# Patient Record
Sex: Male | Born: 1980 | Race: White | Hispanic: No | Smoking: Current every day smoker
Health system: Southern US, Community
[De-identification: ages and names within clinical notes are randomized; demographics above are authoritative.]

## PROBLEM LIST (undated history)

## (undated) DIAGNOSIS — R569 Unspecified convulsions: Secondary | ICD-10-CM

---

## 2005-09-04 ENCOUNTER — Emergency Department: Payer: Self-pay | Admitting: Emergency Medicine

## 2005-09-16 ENCOUNTER — Other Ambulatory Visit: Payer: Self-pay

## 2005-09-16 ENCOUNTER — Emergency Department: Payer: Self-pay | Admitting: Emergency Medicine

## 2005-09-17 ENCOUNTER — Emergency Department: Payer: Self-pay | Admitting: Emergency Medicine

## 2007-02-22 ENCOUNTER — Emergency Department: Payer: Self-pay | Admitting: Emergency Medicine

## 2008-01-08 ENCOUNTER — Emergency Department: Payer: Self-pay | Admitting: Emergency Medicine

## 2008-01-19 ENCOUNTER — Emergency Department: Payer: Self-pay | Admitting: Emergency Medicine

## 2009-01-24 ENCOUNTER — Emergency Department: Payer: Self-pay | Admitting: Emergency Medicine

## 2009-03-18 ENCOUNTER — Emergency Department: Payer: Self-pay | Admitting: Unknown Physician Specialty

## 2010-01-29 ENCOUNTER — Emergency Department: Payer: Self-pay | Admitting: Unknown Physician Specialty

## 2014-04-22 ENCOUNTER — Emergency Department: Payer: Self-pay | Admitting: Emergency Medicine

## 2016-12-10 ENCOUNTER — Emergency Department: Payer: Self-pay

## 2016-12-10 ENCOUNTER — Emergency Department
Admission: EM | Admit: 2016-12-10 | Discharge: 2016-12-10 | Disposition: A | Discharge status: Home / Self Care | Payer: Self-pay | Attending: Emergency Medicine | Admitting: Emergency Medicine

## 2016-12-10 DIAGNOSIS — B9789 Other viral agents as the cause of diseases classified elsewhere: Secondary | ICD-10-CM

## 2016-12-10 DIAGNOSIS — F17200 Nicotine dependence, unspecified, uncomplicated: Secondary | ICD-10-CM | POA: Insufficient documentation

## 2016-12-10 DIAGNOSIS — J069 Acute upper respiratory infection, unspecified: Secondary | ICD-10-CM | POA: Insufficient documentation

## 2016-12-10 MED ORDER — PSEUDOEPH-BROMPHEN-DM 30-2-10 MG/5ML PO SYRP: 10.0000 mL | ORAL_SOLUTION | Freq: Four times a day (QID) | ORAL | 0 refills | Status: DC | PRN

## 2016-12-10 MED ORDER — FLUTICASONE PROPIONATE 50 MCG/ACT NA SUSP: 1.0000 | Freq: Two times a day (BID) | NASAL | 0 refills | Status: DC

## 2016-12-10 MED ORDER — CETIRIZINE HCL 10 MG PO TABS: 10.0000 mg | ORAL_TABLET | Freq: Every day | ORAL | 0 refills | Status: AC

## 2016-12-10 NOTE — ED Triage Notes (Signed)
Pt reports fatigue and cough that began last night. Pt also reporting sore throat. Pt alert and oriented X4, active, cooperative, pt in NAD. RR even and unlabored, color WNL.

## 2016-12-10 NOTE — ED Provider Notes (Signed)
[facility] Emergency Department Provider Note  ____________________________________________  Time seen: Approximately 8:02 PM  I have reviewed the triage vital signs and the nursing notes.   HISTORY  Chief Complaint Cough    HPI William PumaJimmy D Sinor Jr. is a 36 y.o. male who presents emergency Department with 2 day history of his congestion, sore throat, coughing. Patient states that symptoms began insidiously. He has had a subjective fever along with these complaints. No headache, visual changes, neck pain, chest pain, difficulty breathing, abdominal pain, nausea or vomiting. No medications prior to arrival.   History reviewed. No pertinent past medical history.  There are no active problems to display for this patient.   History reviewed. No pertinent surgical history.  Prior to Admission medications   Medication Sig Start Date End Date Taking? Authorizing Provider  brompheniramine-pseudoephedrine-DM 30-2-10 MG/5ML syrup Take 10 mLs by mouth 4 (four) times daily as needed. 12/10/16   Delorise RoyalsJonathan D Jamylah Marinaccio, PA-C  cetirizine (ZYRTEC) 10 MG tablet Take 1 tablet (10 mg total) by mouth daily. 12/10/16   Delorise RoyalsJonathan D Bently Wyss, PA-C  fluticasone (FLONASE) 50 MCG/ACT nasal spray Place 1 spray into both nostrils 2 (two) times daily. 12/10/16   Delorise RoyalsJonathan D Daquavion Catala, PA-C    Allergies Amoxicillin; Aspirin; and Penicillins  No family history on file.  Social History Social History  Substance Use Topics  . Smoking status: Current Every Day Smoker  . Smokeless tobacco: Never Used  . Alcohol use No     Review of Systems  Constitutional: Subjective fever/chills Eyes: No visual changes. No discharge ENT: Positive for nasal congestion and sore throat Cardiovascular: no chest pain. Respiratory: Positive cough. No SOB. Gastrointestinal: No abdominal pain.  No nausea, no vomiting.  No diarrhea.  No constipation. Musculoskeletal: Negative for musculoskeletal pain. Skin:  Negative for rash, abrasions, lacerations, ecchymosis. Neurological: Negative for headaches, focal weakness or numbness. 10-point ROS otherwise negative.  ____________________________________________   PHYSICAL EXAM:  VITAL SIGNS: ED Triage Vitals  Enc Vitals Group     BP 12/10/16 1818 (!) 147/81     Pulse Rate 12/10/16 1818 91     Resp 12/10/16 1818 18     Temp 12/10/16 1818 98 F (36.7 C)     Temp Source 12/10/16 1818 Oral     SpO2 12/10/16 1818 98 %     Weight 12/10/16 1819 192 lb (87.1 kg)     Height 12/10/16 1819 5\' 7"  (1.702 m)     Head Circumference --      Peak Flow --      Pain Score --      Pain Loc --      Pain Edu? --      Excl. in GC? --      Constitutional: Alert and oriented. Well appearing and in no acute distress. Eyes: Conjunctivae are normal. PERRL. EOMI. Head: Atraumatic. ENT:      Ears: He sees and TMs are unremarkable bilaterally      Nose: Moderate clear congestion/rhinnorhea.      Mouth/Throat: Mucous membranes are moist. Her pharynx is mildly erythematous but not edematous. Uvula is midline. Tonsils are unremarkable. Neck: No stridor. Neck is supple with full range of motion Hematological/Lymphatic/Immunilogical: No cervical lymphadenopathy. Cardiovascular: Normal rate, regular rhythm. Normal S1 and S2.  Good peripheral circulation. Respiratory: Normal respiratory effort without tachypnea or retractions. Lungs CTAB. Good air entry to the bases with no decreased or absent breath sounds. Musculoskeletal: Full range of motion to all extremities. No gross  deformities appreciated. Neurologic:  Normal speech and language. No gross focal neurologic deficits are appreciated.  Skin:  Skin is warm, dry and intact. No rash noted. Psychiatric: Mood and affect are normal. Speech and behavior are normal. Patient exhibits appropriate insight and judgement.   ____________________________________________   LABS (all labs ordered are listed, but only abnormal  results are displayed)  Labs Reviewed - No data to display ____________________________________________  EKG   ____________________________________________  RADIOLOGY Festus Barren Emylee Decelle, personally viewed and evaluated these images (plain radiographs) as part of my medical decision making, as well as reviewing the written report by the radiologist.  Dg Chest 2 View  Result Date: 12/10/2016 CLINICAL DATA:  Fatigue and cough. EXAM: CHEST  2 VIEW COMPARISON:  None. FINDINGS: Low lung volumes exaggerate the heart size. The lungs are clear. There is no edema or effusion. The visualized soft tissues and bony thorax are unremarkable. IMPRESSION: Negative two view chest. Electronically Signed   By: Marin Roberts M.D.   On: 12/10/2016 19:27    ____________________________________________    PROCEDURES  Procedure(s) performed:    Procedures    Medications - No data to display   ____________________________________________   INITIAL IMPRESSION / ASSESSMENT AND PLAN / ED COURSE  Pertinent labs & imaging results that were available during my care of the patient were reviewed by me and considered in my medical decision making (see chart for details).  Review of the [place] CSRS was performed in accordance of the [facility] prior to dispensing any controlled drugs.  Clinical Course     Patient's diagnosis is consistent with Probably upper respiratory infection with cough. X-ray reveals no acute cardiopulmonary abnormality. Exam is reassuring with no acute findings.. Patient will be discharged home with prescriptions for symptom control medications to take in addition to Tylenol and Motrin at home. Patient is to follow up with primary care as needed or otherwise directed. Patient is given ED precautions to return to the ED for any worsening or new symptoms.     ____________________________________________  FINAL CLINICAL IMPRESSION(S) / ED DIAGNOSES  Final diagnoses:  Viral  URI with cough      NEW MEDICATIONS STARTED DURING THIS VISIT:  New Prescriptions   BROMPHENIRAMINE-PSEUDOEPHEDRINE-DM 30-2-10 MG/5ML SYRUP    Take 10 mLs by mouth 4 (four) times daily as needed.   CETIRIZINE (ZYRTEC) 10 MG TABLET    Take 1 tablet (10 mg total) by mouth daily.   FLUTICASONE (FLONASE) 50 MCG/ACT NASAL SPRAY    Place 1 spray into both nostrils 2 (two) times daily.        This chart was dictated using voice recognition software/Dragon. Despite best efforts to proofread, errors can occur which can change the meaning. Any change was purely unintentional.    Racheal Patches, PA-C 12/10/16 2011    Minna Antis, MD 12/10/16 [phone]

## 2016-12-10 NOTE — ED Notes (Signed)
See triage note. States he developed cough and body aches yesterday  Woke up this am feeling tired and has a scratchy throat and afebrile on arrival

## 2016-12-20 ENCOUNTER — Encounter: Payer: Self-pay | Admitting: Emergency Medicine

## 2016-12-20 ENCOUNTER — Emergency Department
Admission: EM | Admit: 2016-12-20 | Discharge: 2016-12-20 | Disposition: A | Discharge status: Home / Self Care | Payer: Self-pay | Attending: Emergency Medicine | Admitting: Emergency Medicine

## 2016-12-20 ENCOUNTER — Emergency Department: Payer: Self-pay

## 2016-12-20 DIAGNOSIS — F17210 Nicotine dependence, cigarettes, uncomplicated: Secondary | ICD-10-CM | POA: Insufficient documentation

## 2016-12-20 DIAGNOSIS — Y9389 Activity, other specified: Secondary | ICD-10-CM | POA: Insufficient documentation

## 2016-12-20 DIAGNOSIS — M7042 Prepatellar bursitis, left knee: Secondary | ICD-10-CM | POA: Insufficient documentation

## 2016-12-20 DIAGNOSIS — M7052 Other bursitis of knee, left knee: Secondary | ICD-10-CM

## 2016-12-20 MED ORDER — PREDNISONE 20 MG PO TABS: ORAL_TABLET | ORAL | 0 refills | Status: AC

## 2016-12-20 NOTE — ED Notes (Signed)
See triage note   States he is having increased with some swelling to left knee over the past 2 weeks  Denies any specific injury

## 2016-12-20 NOTE — ED Provider Notes (Signed)
[facility] Emergency Department Provider Note  ____________________________________________  Time seen: Approximately 7:04 PM  I have reviewed the triage vital signs and the nursing notes.   HISTORY  Chief Complaint Knee Pain    HPI William Hardin. is a 36 y.o. male , NAD, presents to the emergency department with 3 week history of left knee pain and swelling. Patient denies any specific injury, trauma or fall to incite the pain. States the pain is worse when he squats down and attempts to stand back up. 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 Denies any numbness, weakness or tingling. Has not noted any redness or abnormal warmth. Denies fevers or chills. Has been taking Tylenol without any alleviation of pain.   History reviewed. No pertinent past medical history.  There are no active problems to display for this patient.   History reviewed. No pertinent surgical history.  Prior to Admission medications   Medication Sig Start Date End Date Taking? Authorizing Provider  cetirizine (ZYRTEC) 10 MG tablet Take 1 tablet (10 mg total) by mouth daily. 12/10/16   Delorise RoyalsJonathan D Cuthriell, PA-C  predniSONE (DELTASONE) 20 MG tablet Take 2 tablets by mouth, once daily, for 5 days 12/20/16   Ceniya Fowers L Saydee Zolman, PA-C    Allergies Amoxicillin; Aspirin; Codeine; and Penicillins  History reviewed. No pertinent family history.  Social History Social History  Substance Use Topics  . Smoking status: Current Every Day Smoker    Packs/day: 0.50    Types: Cigarettes  . Smokeless tobacco: Never Used  . Alcohol use No     Review of Systems  Constitutional: No fever/chills Musculoskeletal: Positive left knee pain.  Skin: Positive for left knee swelling. Negative for rash, Redness, abnormal warmth, skin sores, open wounds. Neurological: Negative for Numbness, weakness,  tingling  ____________________________________________   PHYSICAL EXAM:  VITAL SIGNS: ED Triage Vitals  Enc Vitals Group     BP 12/20/16 1739 (!) 110/93     Pulse Rate 12/20/16 1739 90     Resp 12/20/16 1739 18     Temp 12/20/16 1739 98.4 F (36.9 C)     Temp Source 12/20/16 1739 Oral     SpO2 12/20/16 1739 97 %     Weight 12/20/16 1740 195 lb (88.5 kg)     Height 12/20/16 1740 5\' 7"  (1.702 m)     Head Circumference --      Peak Flow --      Pain Score 12/20/16 1740 6     Pain Loc --      Pain Edu? --      Excl. in GC? --      Constitutional: Alert and oriented. Well appearing and in no acute distress. Eyes: Conjunctivae are normal.  Head: Atraumatic. Cardiovascular: Good peripheral circulation with 2+ pulses noted in the left lower extremity. Respiratory: Normal respiratory effort without tachypnea or retractions.  Musculoskeletal: Tenderness and mild swelling noted about the suprapatellar region. No prepatellar erythema or warmth. No tenderness about the patella or with patellofemoral ground. Patient has full range of motion of the left knee but pain with full flexion and extension at the suprapatellar region. No laxity with anterior or posterior drawer. No laxity with varus and valgus stress. No locking of the knee with range of motion. No lower extremity tenderness nor edema.  No joint effusions. Neurologic:  Normal speech and language. No gross focal neurologic deficits are  appreciated.  Skin:  Skin is warm, dry and intact. No rash, redness, abnormal warmth noted. Psychiatric: Mood and affect are normal. Speech and behavior are normal. Patient exhibits appropriate insight and judgement.   ____________________________________________   LABS  None ____________________________________________  EKG  None ____________________________________________  RADIOLOGY I, Hope Pigeon, personally viewed and evaluated these images (plain radiographs) as part of my medical  decision making, as well as reviewing the written report by the radiologist.  Dg Knee Complete 4 Views Left  Result Date: 12/20/2016 CLINICAL DATA:  Pain and swelling of left knee x 2-3 weeks, progressively getting worse. Much worse today. No known injury, no prior surgeries. EXAM: LEFT KNEE - COMPLETE 4+ VIEW COMPARISON:  03/18/2009 FINDINGS: No fracture.  No bone lesion. Knee joint is normally spaced and aligned.  No arthropathic change. No joint effusion. Soft tissues are unremarkable. IMPRESSION: Negative. Electronically Signed   By: Amie Portland M.D.   On: 12/20/2016 18:09    ____________________________________________    PROCEDURES  Procedure(s) performed: None   Procedures   Medications - No data to display   ____________________________________________   INITIAL IMPRESSION / ASSESSMENT AND PLAN / ED COURSE  Pertinent labs & imaging results that were available during my care of the patient were reviewed by me and considered in my medical decision making (see chart for details).  Clinical Course     Patient's diagnosis is consistent with Suprapatellar bursitis of left knee. Patient will be discharged home with prescriptions for prednisone to take as directed. Patient should purchase a neoprene knee sleeve over-the-counter to wear for support. Patient is to follow up with Dr. Martha Clan in orthopedics if symptoms persist past this treatment course. Patient is given ED precautions to return to the ED for any worsening or new symptoms.    ____________________________________________  FINAL CLINICAL IMPRESSION(S) / ED DIAGNOSES  Final diagnoses:  Suprapatellar bursitis of left knee      NEW MEDICATIONS STARTED DURING THIS VISIT:  New Prescriptions   PREDNISONE (DELTASONE) 20 MG TABLET    Take 2 tablets by mouth, once daily, for 5 days         Hope Pigeon, PA-C 12/20/16 1939    Myrna Blazer, MD 12/20/16 [phone]

## 2016-12-20 NOTE — ED Triage Notes (Signed)
Pt reports chronic left knee pain that has worsened over the past 2 weeks with swelling for the past 3 days. 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 Pt walks with a slight limp.  Pt has not taken any medications for pain or swelling.

## 2017-05-27 ENCOUNTER — Emergency Department: Payer: Commercial Managed Care - PPO

## 2017-05-27 ENCOUNTER — Encounter: Payer: Self-pay | Admitting: *Deleted

## 2017-05-27 ENCOUNTER — Emergency Department
Admission: EM | Admit: 2017-05-27 | Discharge: 2017-05-27 | Disposition: A | Discharge status: Home / Self Care | Payer: Commercial Managed Care - PPO | Attending: Student in an Organized Health Care Education/Training Program | Admitting: Student in an Organized Health Care Education/Training Program

## 2017-05-27 DIAGNOSIS — F17210 Nicotine dependence, cigarettes, uncomplicated: Secondary | ICD-10-CM | POA: Insufficient documentation

## 2017-05-27 DIAGNOSIS — R079 Chest pain, unspecified: Secondary | ICD-10-CM | POA: Insufficient documentation

## 2017-05-27 DIAGNOSIS — Z79899 Other long term (current) drug therapy: Secondary | ICD-10-CM | POA: Diagnosis not present

## 2017-05-27 DIAGNOSIS — E860 Dehydration: Secondary | ICD-10-CM | POA: Insufficient documentation

## 2017-05-27 LAB — BASIC METABOLIC PANEL
ANION GAP: 6 (ref 5–15)
BUN: 15 mg/dL (ref 6–20)
CALCIUM: 9.2 mg/dL (ref 8.9–10.3)
CO2: 30 mmol/L (ref 22–32)
Chloride: 104 mmol/L (ref 101–111)
Creatinine, Ser: 0.95 mg/dL (ref 0.61–1.24)
GFR calc Af Amer: >60 mL/min (ref >60)
GFR calc non Af Amer: >60 mL/min (ref >60)
GLUCOSE: 117 mg/dL — AB (ref 65–99)
Potassium: 4.5 mmol/L (ref 3.5–5.1)
Sodium: 140 mmol/L (ref 135–145)

## 2017-05-27 LAB — CBC
HCT: 40.8 % (ref 40.0–52.0)
Hemoglobin: 14.4 g/dL (ref 13.0–18.0)
MCH: 32 pg (ref 26.0–34.0)
MCHC: 35.1 g/dL (ref 32.0–36.0)
MCV: 91.1 fL (ref 80.0–100.0)
Platelets: 268 10*3/uL (ref 150–440)
RBC: 4.48 MIL/uL (ref 4.40–5.90)
RDW: 13.3 % (ref 11.5–14.5)
WBC: 11.5 10*3/uL — ABNORMAL HIGH (ref 3.8–10.6)

## 2017-05-27 LAB — GLUCOSE, CAPILLARY: Glucose-Capillary: 100 mg/dL — ABNORMAL HIGH (ref 65–99)

## 2017-05-27 LAB — TROPONIN I: Troponin I: <0.03 ng/mL (ref <0.03)

## 2017-05-27 MED ORDER — KETOROLAC TROMETHAMINE 30 MG/ML IJ SOLN
15.0000 mg | Freq: Once | INTRAMUSCULAR | Status: AC
  Administered 2017-05-27: 15 mg via INTRAVENOUS
  Filled 2017-05-27: qty 1

## 2017-05-27 MED ORDER — SODIUM CHLORIDE 0.9 % IV BOLUS (SEPSIS)
1000.0000 mL | Freq: Once | INTRAVENOUS | Status: AC
  Administered 2017-05-27: 1000 mL via INTRAVENOUS

## 2017-05-27 MED ORDER — ALBUTEROL SULFATE HFA 108 (90 BASE) MCG/ACT IN AERS: 2.0000 | INHALATION_SPRAY | Freq: Four times a day (QID) | RESPIRATORY_TRACT | 2 refills | Status: AC | PRN

## 2017-05-27 MED ORDER — IPRATROPIUM-ALBUTEROL 0.5-2.5 (3) MG/3ML IN SOLN
3.0000 mL | Freq: Once | RESPIRATORY_TRACT | Status: AC
  Administered 2017-05-27: 3 mL via RESPIRATORY_TRACT
  Filled 2017-05-27: qty 3

## 2017-05-27 NOTE — ED Provider Notes (Signed)
[facility] Emergency Department Provider Note    First MD Initiated Contact with Patient 05/27/17 1935     (approximate)  I have reviewed the triage vital signs and the nursing notes.   HISTORY  Chief Complaint Chest Pain    HPI Merrilyn PumaJimmy D Moscoso Jr. is a 36 y.o. male presents with left-sided chest discomfort associated shortness of breath that occurred while at work earlier today. Patient states he's also been feeling weak and lightheaded. 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 States he's had had decreased oral intake and is not staying hydrated. Denies any cough. No fevers. No exertional chest pain. Has had symptoms consistent with this in the past associated with dehydration.   PMH: previously healthy FMH: DM PSH: no recent surgeries There are no active problems to display for this patient.     Prior to Admission medications   Medication Sig Start Date End Date Taking? Authorizing Provider  cetirizine (ZYRTEC) 10 MG tablet Take 1 tablet (10 mg total) by mouth daily. 12/10/16   Cuthriell, Delorise RoyalsJonathan D, PA-C  predniSONE (DELTASONE) 20 MG tablet Take 2 tablets by mouth, once daily, for 5 days 12/20/16   Hagler, Jami L, PA-C    Allergies Amoxicillin; Aspirin; Codeine; and Penicillins    Social History Social History  Substance Use Topics  . Smoking status: Current Every Day Smoker    Packs/day: 0.50    Types: Cigarettes  . Smokeless tobacco: Never Used  . Alcohol use No    Review of Systems Patient denies headaches, rhinorrhea, blurry vision, numbness, shortness of breath, chest pain, edema, cough, abdominal pain, nausea, vomiting, diarrhea, dysuria, fevers, rashes or hallucinations unless otherwise stated above in HPI. ____________________________________________   PHYSICAL EXAM:  VITAL SIGNS: Vitals:   05/27/17 1804  BP: 131/78  Pulse: 72  Resp: 20  Temp: 98.4 F (36.9 C)      Constitutional: Alert and oriented. Well appearing and in no acute distress. Eyes: Conjunctivae are normal.  Head: Atraumatic. Nose: No congestion/rhinnorhea. Mouth/Throat: Mucous membranes are moist.   Neck: No stridor. Painless ROM.  Cardiovascular: Normal rate, regular rhythm. Grossly normal heart sounds.  Good peripheral circulation. Respiratory: Normal respiratory effort.  No retractions. Lungs CTAB. Gastrointestinal: Soft and nontender. No distention. No abdominal bruits. No CVA tenderness. Genitourinary:  Musculoskeletal: No lower extremity tenderness nor edema.  No joint effusions. Neurologic:  Normal speech and language. No gross focal neurologic deficits are appreciated. No facial droop Skin:  Skin is warm, dry and intact. No rash noted. Psychiatric: Mood and affect are normal. Speech and behavior are normal.  ____________________________________________   LABS (all labs ordered are listed, but only abnormal results are displayed)  Results for orders placed or performed during the hospital encounter of 05/27/17 (from the past 24 hour(s))  Basic metabolic panel     Status: Abnormal   Collection Time: 05/27/17  6:00 PM  Result Value Ref Range   Sodium 140 135 - 145 mmol/L   Potassium 4.5 3.5 - 5.1 mmol/L   Chloride 104 101 - 111 mmol/L   CO2 30 22 - 32 mmol/L   Glucose, Bld 117 (H) 65 - 99 mg/dL   BUN 15 6 - 20 mg/dL   Creatinine, Ser 1.610.95 0.61 - 1.24 mg/dL   Calcium 9.2 8.9 - 09.610.3 mg/dL   GFR calc non Af Amer >60 >60 mL/min   GFR calc Af Amer >60 >60 mL/min  Anion gap 6 5 - 15  CBC     Status: Abnormal   Collection Time: 05/27/17  6:00 PM  Result Value Ref Range   WBC 11.5 (H) 3.8 - 10.6 K/uL   RBC 4.48 4.40 - 5.90 MIL/uL   Hemoglobin 14.4 13.0 - 18.0 g/dL   HCT 16.1 09.6 - 04.5 %   MCV 91.1 80.0 - 100.0 fL   MCH 32.0 26.0 - 34.0 pg   MCHC 35.1 32.0 - 36.0 g/dL   RDW 40.9 81.1 - 91.4 %   Platelets 268 150 - 440 K/uL  Troponin I     Status: None    Collection Time: 05/27/17  6:00 PM  Result Value Ref Range   Troponin I <0.03 <0.03 ng/mL   ____________________________________________  EKG My review and personal interpretation at Time: 17:57   Indication: chest pain  Rate: 75  Rhythm: sinus Axis: normal Other: J point elevation in V2 and V 3, no reciprocal changes, normal intervals ____________________________________________  RADIOLOGY  I personally reviewed all radiographic images ordered to evaluate for the above acute complaints and reviewed radiology reports and findings.  These findings were personally discussed with the patient.  Please see medical record for radiology report.  ____________________________________________   PROCEDURES  Procedure(s) performed:  Procedures    Critical Care performed: no ____________________________________________   INITIAL IMPRESSION / ASSESSMENT AND PLAN / ED COURSE  Pertinent labs & imaging results that were available during my care of the patient were reviewed by me and considered in my medical decision making (see chart for details).  DDX: ACS, pericarditis, esophagitis, boerhaaves, pe, dissection, pna, bronchitis, costochondritis   Maykel D Lorie Melichar. is a 36 y.o. who presents to the ED with chest pain as described above.  Patient is AFVSS in ED. Exam as above. Given current presentation have considered the above differential. Phyllis clinically consistent with ACS. The patient has a heart score of essentially 0 and has no evidence of acute ischemic changes on EKG and his troponin is negative. He is low risk by well's criteria and is PERC negative. His abdominal exam is soft and benign. Is not consistent with dissection. No evidence of pneumonia. Possible component of bronchitis. Denies any majority of the symptoms related to heat illness and dehydration. Will provide IV fluids.    Clinical Course as of May 28 2335  Wed May 27, 2017  2226 Repeat troponin negative. Patient had  acute distress. Patient stable for follow-up with PCP.  Have discussed with the patient and available family all diagnostics and treatments performed thus far and all questions were answered to the best of my ability. The patient demonstrates understanding and agreement with plan.   [PR]    Clinical Course User Index [PR] Willy Eddy, MD     ____________________________________________   FINAL CLINICAL IMPRESSION(S) / ED DIAGNOSES  Final diagnoses:  Chest pain, unspecified type  Dehydration      NEW MEDICATIONS STARTED DURING THIS VISIT:  New Prescriptions   No medications on file     Note:  This document was prepared using Dragon voice recognition software and may include unintentional dictation errors.    Willy Eddy, MD 05/27/17 [phone]

## 2017-05-27 NOTE — ED Triage Notes (Signed)
Pt reports chest pain, sob for 1 day.  Pt in left chest.  cig smoker.  No cough   Pt alert.

## 2017-05-27 NOTE — ED Notes (Signed)

## 2017-12-10 ENCOUNTER — Emergency Department
Admission: EM | Admit: 2017-12-10 | Discharge: 2017-12-10 | Disposition: A | Discharge status: Home / Self Care | Payer: Commercial Managed Care - PPO | Attending: Emergency Medicine | Admitting: Emergency Medicine

## 2017-12-10 ENCOUNTER — Encounter: Payer: Self-pay | Admitting: Emergency Medicine

## 2017-12-10 DIAGNOSIS — J069 Acute upper respiratory infection, unspecified: Secondary | ICD-10-CM | POA: Insufficient documentation

## 2017-12-10 DIAGNOSIS — Z79899 Other long term (current) drug therapy: Secondary | ICD-10-CM | POA: Insufficient documentation

## 2017-12-10 DIAGNOSIS — B9789 Other viral agents as the cause of diseases classified elsewhere: Secondary | ICD-10-CM

## 2017-12-10 DIAGNOSIS — R51 Headache: Secondary | ICD-10-CM | POA: Insufficient documentation

## 2017-12-10 DIAGNOSIS — F17210 Nicotine dependence, cigarettes, uncomplicated: Secondary | ICD-10-CM | POA: Insufficient documentation

## 2017-12-10 HISTORY — DX: Unspecified convulsions: R56.9

## 2017-12-10 MED ORDER — PSEUDOEPHEDRINE HCL 30 MG PO TABS: 30.0000 mg | ORAL_TABLET | Freq: Four times a day (QID) | ORAL | 2 refills | Status: AC | PRN

## 2017-12-10 MED ORDER — OXYMETAZOLINE HCL 0.05 % NA SOLN: 2.0000 | Freq: Two times a day (BID) | NASAL | 2 refills | Status: AC

## 2017-12-10 NOTE — ED Provider Notes (Signed)
[facility] Emergency Department Provider Note  ____________________________________________  Time seen: Approximately 10:36 PM  I have reviewed the triage vital signs and the nursing notes.   HISTORY  Chief Complaint URI    HPI Merrilyn PumaJimmy D Valley Jr. is a 37 y.o. male presents to the emergency department with rhinorrhea, congestion, headache and nonproductive cough for the past 3 days.  Patient has not evaluated his temperature at home.  He has not experienced recent travel.  No major changes in stooling or urinary habits.  No emesis or diarrhea.  He denies chest pain, chest tightness, nausea, vomiting abdominal pain.   Past Medical History:  Diagnosis Date  . Seizures (HCC)     There are no active problems to display for this patient.   History reviewed. No pertinent surgical history.  Prior to Admission medications   Medication Sig Start Date End Date Taking? Authorizing Provider  albuterol (PROVENTIL HFA;VENTOLIN HFA) 108 (90 Base) MCG/ACT inhaler Inhale 2 puffs into the lungs every 6 (six) hours as needed for wheezing or shortness of breath. 05/27/17   Willy Eddyobinson, Patrick, MD  cetirizine (ZYRTEC) 10 MG tablet Take 1 tablet (10 mg total) by mouth daily. 12/10/16   Cuthriell, Delorise RoyalsJonathan D, PA-C  oxymetazoline (AFRIN) 0.05 % nasal spray Place 2 sprays into both nostrils 2 (two) times daily. 12/10/17 12/10/18  Orvil FeilWoods, Yaminah Clayborn M, PA-C  predniSONE (DELTASONE) 20 MG tablet Take 2 tablets by mouth, once daily, for 5 days 12/20/16   Hagler, Jami L, PA-C  pseudoephedrine (SUDAFED) 30 MG tablet Take 1 tablet (30 mg total) by mouth every 6 (six) hours as needed for congestion. 12/10/17 12/10/18  Orvil FeilWoods, Arhaan Chesnut M, PA-C    Allergies Amoxicillin; Aspirin; Codeine; and Penicillins  No family history on file.  Social History Social History   Tobacco Use  . Smoking status: Current Every Day Smoker    Packs/day: 0.50    Types: Cigarettes  . Smokeless tobacco: Never Used   Substance Use Topics  . Alcohol use: No  . Drug use: No      Review of Systems  Constitutional: Patient has low grade fever.  Eyes: No visual changes. No discharge ENT: Patient has congestion.  Cardiovascular: no chest pain. Respiratory: Patient has cough.  Gastrointestinal: No abdominal pain.  No nausea, no vomiting. Patient had diarrhea.  Genitourinary: Negative for dysuria. No hematuria Musculoskeletal: Patient has myalgias.  Skin: Negative for rash, abrasions, lacerations, ecchymosis. Neurological: Patient has headache, no focal weakness or numbness.     ____________________________________________   PHYSICAL EXAM:  VITAL SIGNS: ED Triage Vitals  Enc Vitals Group     BP 12/10/17 2119 (!) 127/99     Pulse Rate 12/10/17 2119 (!) 101     Resp 12/10/17 2119 20     Temp 12/10/17 2119 98.2 F (36.8 C)     Temp Source 12/10/17 2119 Oral     SpO2 12/10/17 2119 97 %     Weight 12/10/17 2120 182 lb (82.6 kg)     Height 12/10/17 2120 5\' 7"  (1.702 m)     Head Circumference --      Peak Flow --      Pain Score 12/10/17 2119 2     Pain Loc --      Pain Edu? --      Excl. in GC? --      Constitutional: Alert and oriented. Patient is lying supine. Eyes: Conjunctivae are normal. PERRL. EOMI. Head: Atraumatic. ENT:  Ears: Tympanic membranes are mildly injected with mild effusion bilaterally.       Nose: No congestion/rhinnorhea.      Mouth/Throat: Mucous membranes are moist. Posterior pharynx is mildly erythematous.  Hematological/Lymphatic/Immunilogical: No cervical lymphadenopathy.  Cardiovascular: Normal rate, regular rhythm. Normal S1 and S2.  Good peripheral circulation. Respiratory: Normal respiratory effort without tachypnea or retractions. Lungs CTAB. Good air entry to the bases with no decreased or absent breath sounds. Gastrointestinal: Bowel sounds 4 quadrants. Soft and nontender to palpation. No guarding or rigidity. No palpable masses. No distention. No  CVA tenderness. Musculoskeletal: Full range of motion to all extremities. No gross deformities appreciated. Neurologic:  Normal speech and language. No gross focal neurologic deficits are appreciated.  Skin:  Skin is warm, dry and intact. No rash noted. Psychiatric: Mood and affect are normal. Speech and behavior are normal. Patient exhibits appropriate insight and judgement.    ____________________________________________   LABS (all labs ordered are listed, but only abnormal results are displayed)  Labs Reviewed - No data to display ____________________________________________  EKG   ____________________________________________  RADIOLOGY   No results found.  ____________________________________________    PROCEDURES  Procedure(s) performed:    Procedures    Medications - No data to display   ____________________________________________   INITIAL IMPRESSION / ASSESSMENT AND PLAN / ED COURSE  Pertinent labs & imaging results that were available during my care of the patient were reviewed by me and considered in my medical decision making (see chart for details).  Review of the [place] CSRS was performed in accordance of the [facility] prior to dispensing any controlled drugs.     Assessment and plan Viral URI Patient presents to the emergency department with rhinorrhea, congestion, nonproductive cough and low-grade fever.  History and physical exam findings are consistent with a viral URI.  Differential diagnosis originally included influenza versus unspecified viral URI.  Unspecified viral URI is likely at this time.  Patient was advised to follow-up with primary care as needed.  All patient questions were answered.    ____________________________________________  FINAL CLINICAL IMPRESSION(S) / ED DIAGNOSES  Final diagnoses:  Viral URI with cough      NEW MEDICATIONS STARTED DURING THIS VISIT:  ED Discharge Orders        Ordered    oxymetazoline  (AFRIN) 0.05 % nasal spray  2 times daily     12/10/17 2228    pseudoephedrine (SUDAFED) 30 MG tablet  Every 6 hours PRN     12/10/17 2228          This chart was dictated using voice recognition software/Dragon. Despite best efforts to proofread, errors can occur which can change the meaning. Any change was purely unintentional.    Orvil Feil, PA-C 12/10/17 2240    Sharman Cheek, MD 12/10/17 2337

## 2017-12-10 NOTE — ED Triage Notes (Signed)
Pt comes into the ED via POV c/o cough and cold symptoms for 3 days.  Denies any known fevers at home.  Patient states he feels fine during the day but cant sleep because of the congestion at night.  Denies shortness of breath or chest pain.  Patient ambulatory to triage and in NAD with even and unlabored respirations.

## 2017-12-10 NOTE — ED Notes (Addendum)
Pt reports recent URI sx in family members. Pt c/o "I don't know I just ain't been feeling good, haven't slept good in a couple days". Pt reports walking to ED to be evaluated without complications.

## 2018-06-16 ENCOUNTER — Emergency Department
Admission: EM | Admit: 2018-06-16 | Discharge: 2018-06-16 | Disposition: A | Discharge status: Home / Self Care | Payer: Self-pay | Attending: Emergency Medicine | Admitting: Emergency Medicine

## 2018-06-16 ENCOUNTER — Encounter: Payer: Self-pay | Admitting: Emergency Medicine

## 2018-06-16 DIAGNOSIS — Z5321 Procedure and treatment not carried out due to patient leaving prior to being seen by health care provider: Secondary | ICD-10-CM | POA: Insufficient documentation

## 2018-06-16 DIAGNOSIS — R200 Anesthesia of skin: Secondary | ICD-10-CM | POA: Insufficient documentation

## 2018-06-16 LAB — BASIC METABOLIC PANEL
Anion gap: 7 (ref 5–15)
BUN: 12 mg/dL (ref 6–20)
CHLORIDE: 107 mmol/L (ref 98–111)
CO2: 29 mmol/L (ref 22–32)
CREATININE: 0.95 mg/dL (ref 0.61–1.24)
Calcium: 9 mg/dL (ref 8.9–10.3)
GFR calc non Af Amer: >60 mL/min (ref >60)
Glucose, Bld: 96 mg/dL (ref 70–99)
POTASSIUM: 3.7 mmol/L (ref 3.5–5.1)
SODIUM: 143 mmol/L (ref 135–145)

## 2018-06-16 LAB — CBC
HCT: 41.4 % (ref 40.0–52.0)
HEMOGLOBIN: 14.5 g/dL (ref 13.0–18.0)
MCH: 32.3 pg (ref 26.0–34.0)
MCHC: 34.9 g/dL (ref 32.0–36.0)
MCV: 92.3 fL (ref 80.0–100.0)
Platelets: 256 10*3/uL (ref 150–440)
RBC: 4.49 MIL/uL (ref 4.40–5.90)
RDW: 13.5 % (ref 11.5–14.5)
WBC: 9.5 10*3/uL (ref 3.8–10.6)

## 2018-06-16 NOTE — ED Notes (Signed)
Pt left AMA without being seen by EDP.

## 2018-06-16 NOTE — ED Triage Notes (Signed)
Patient presents to ED via POV from home with c/o bilateral hand numbness x 1 month. Patient reports when the numbness goes away he gets a sharp pain that goes up his arms.

## 2018-06-16 NOTE — ED Notes (Signed)
Pt wanting to know about work note and discharge. EDP notified.

## 2018-06-29 ENCOUNTER — Emergency Department: Payer: Self-pay

## 2018-06-29 ENCOUNTER — Other Ambulatory Visit: Payer: Self-pay

## 2018-06-29 ENCOUNTER — Encounter: Payer: Self-pay | Admitting: Emergency Medicine

## 2018-06-29 ENCOUNTER — Emergency Department
Admission: EM | Admit: 2018-06-29 | Discharge: 2018-06-29 | Disposition: A | Discharge status: Home / Self Care | Payer: Self-pay | Attending: Emergency Medicine | Admitting: Emergency Medicine

## 2018-06-29 DIAGNOSIS — R200 Anesthesia of skin: Secondary | ICD-10-CM | POA: Insufficient documentation

## 2018-06-29 DIAGNOSIS — R202 Paresthesia of skin: Secondary | ICD-10-CM | POA: Insufficient documentation

## 2018-06-29 DIAGNOSIS — G5602 Carpal tunnel syndrome, left upper limb: Secondary | ICD-10-CM | POA: Insufficient documentation

## 2018-06-29 DIAGNOSIS — F17210 Nicotine dependence, cigarettes, uncomplicated: Secondary | ICD-10-CM | POA: Insufficient documentation

## 2018-06-29 DIAGNOSIS — Z79899 Other long term (current) drug therapy: Secondary | ICD-10-CM | POA: Insufficient documentation

## 2018-06-29 NOTE — ED Triage Notes (Signed)
Pt to ED via POV with c/o LFT arm pain and numbness x3wks. States pain radiating down his neck. Pt states injured from work. PT denies CP or SOB. VSS

## 2018-06-29 NOTE — ED Provider Notes (Addendum)
[facility] Emergency Department Provider Note  ____________________________________________  Time seen: Approximately 8:18 PM  I have reviewed the triage vital signs and the nursing notes.   HISTORY  Chief Complaint Arm Pain    HPI William Hardin. is a 37 y.o. male with no past medical history who complains of numbness and tingling in the left forearm and hand over the past month.  He relates it to heavy lifting he did at his former work and an episode where his hand was compressed under a heavy rack.  He was seen in the ED about 3 weeks ago according to electronic medical record, complaining of bilateral hand tingling at that time.  He now states that it is only the left hand not the right.  Denies any history of head injury or neck injury.  Denies neck pain.  No vision changes.  He feels like his left hand is getting weaker and it is his dominant side.  Symptoms are intermittent, worse in the morning when he wakes up or after repetitive hand movements or leaning on the left forearm and elbow.  No alleviating factors.  Mild to moderate severity.   Also complains of a mobile nodule just next to third knuckle of the left hand.   Past Medical History:  Diagnosis Date  . Seizures (HCC)      There are no active problems to display for this patient.    History reviewed. No pertinent surgical history.   Prior to Admission medications   Medication Sig Start Date End Date Taking? Authorizing Provider  albuterol (PROVENTIL HFA;VENTOLIN HFA) 108 (90 Base) MCG/ACT inhaler Inhale 2 puffs into the lungs every 6 (six) hours as needed for wheezing or shortness of breath. 05/27/17   Willy Eddy, MD  cetirizine (ZYRTEC) 10 MG tablet Take 1 tablet (10 mg total) by mouth daily. 12/10/16   Cuthriell, Delorise Royals, PA-C  oxymetazoline (AFRIN) 0.05 % nasal spray Place 2 sprays into both nostrils 2 (two) times daily. 12/10/17 12/10/18  Orvil Feil, PA-C  predniSONE  (DELTASONE) 20 MG tablet Take 2 tablets by mouth, once daily, for 5 days 12/20/16   Hagler, Jami L, PA-C  pseudoephedrine (SUDAFED) 30 MG tablet Take 1 tablet (30 mg total) by mouth every 6 (six) hours as needed for congestion. 12/10/17 12/10/18  Orvil Feil, PA-C     Allergies Amoxicillin; Aspirin; Codeine; and Penicillins   No family history on file.  Social History Social History   Tobacco Use  . Smoking status: Current Every Day Smoker    Packs/day: 0.50    Types: Cigarettes  . Smokeless tobacco: Never Used  Substance Use Topics  . Alcohol use: No  . Drug use: No    Review of Systems  Constitutional:   No fever or chills.  Cardiovascular:   No chest pain or syncope. Respiratory:   No dyspnea or cough. Gastrointestinal:   Negative for abdominal pain, vomiting and diarrhea.  Musculoskeletal: Left hand pain as above All other systems reviewed and are negative except as documented above in ROS and HPI.  ____________________________________________   PHYSICAL EXAM:  VITAL SIGNS: ED Triage Vitals  Enc Vitals Group     BP 06/29/18 1533 113/77     Pulse Rate 06/29/18 1533 72     Resp 06/29/18 1533 16     Temp 06/29/18 1533 98.6 F (37 C)     Temp Source 06/29/18 1533 Oral     SpO2 06/29/18 1533 97 %  Weight 06/29/18 1534 185 lb (83.9 kg)     Height 06/29/18 1534 5\' 8"  (1.727 m)     Head Circumference --      Peak Flow --      Pain Score 06/29/18 1534 0     Pain Loc --      Pain Edu? --      Excl. in GC? --     Vital signs reviewed, nursing assessments reviewed.   Constitutional:   Alert and oriented. Non-toxic appearance. Eyes:   Conjunctivae are normal.  ENT      Head:   Normocephalic and atraumatic.            Neck:   No meningismus. Full ROM.  No midline spinal tenderness.  No pain with axial loading of the C-spine including with head rotated left and right Hematological/Lymphatic/Immunilogical:   No cervical lymphadenopathy. Cardiovascular:    RRR. Symmetric bilateral radial and DP pulses.  No murmurs. Cap refill less than 2 seconds. Respiratory:   Normal respiratory effort without tachypnea/retractions. Breath sounds are clear and equal bilaterally. No wheezes/rales/rhonchi. Musculoskeletal:   Normal range of motion in all extremities. No joint effusions.  No lower extremity tenderness.  No edema.  No midline spinal tenderness. Positive Tinel and Phalen signs on the left.  Symmetric strength of hand flexors, extensors, lumbricals.  No muscular atrophy. There is a small approximately 1 mm firm nodule visible next to the third MCP joint when the finger is flexed.  As the third finger is extended, the nodule retracts proximally in line with the flexor tendon course. Neurologic:   Normal speech and language.  Motor grossly intact. No acute focal neurologic deficits are appreciated.  Skin:    Skin is warm, dry and intact. No rash noted.  No petechiae, purpura, or bullae.  No wounds  ____________________________________________    LABS (pertinent positives/negatives) (all labs ordered are listed, but only abnormal results are displayed) Labs Reviewed - No data to display ____________________________________________   EKG  Interpreted by me  Date: 06/29/2018  Rate: 61  Rhythm: normal sinus rhythm  QRS Axis: normal  Intervals: normal  ST/T Wave abnormalities: normal  Conduction Disutrbances: none  Narrative Interpretation: unremarkable      ____________________________________________    RADIOLOGY  Dg Hand Complete Left  Result Date: 06/29/2018 CLINICAL DATA:  Tender right third metacarpal. Injury several weeks ago EXAM: LEFT HAND - COMPLETE 3+ VIEW COMPARISON:  12/31/2006 FINDINGS: There is no evidence of fracture or dislocation. There is no evidence of arthropathy or other focal bone abnormality. Soft tissues are unremarkable. IMPRESSION: Negative. Electronically Signed   By: Marlan Palau M.D.   On: 06/29/2018 19:26     ____________________________________________   PROCEDURES Procedures  ____________________________________________    CLINICAL IMPRESSION / ASSESSMENT AND PLAN / ED COURSE  Pertinent labs & imaging results that were available during my care of the patient were reviewed by me and considered in my medical decision making (see chart for details).    Patient presents with symptoms consistent with carpal tunnel syndrome.  At present there is no concern for cervical radiculopathy or central cord syndrome.  X-ray obtained to evaluate for possible avulsion fracture, this is reported as negative.  He does have intact strength so I do not think he has had a ruptured tendon.  His symptoms and exam are consistent with carpal tunnel syndrome so I will provide a wrist brace to wear at all times especially sleep the next week and refer to  hand surgery for further evaluation.      ____________________________________________   FINAL CLINICAL IMPRESSION(S) / ED DIAGNOSES    Final diagnoses:  Carpal tunnel syndrome of left wrist     ED Discharge Orders    None      Portions of this note were generated with dragon dictation software. Dictation errors may occur despite best attempts at proofreading.    Sharman CheekStafford, Ahmarion Saraceno, MD 06/29/18 2024    Sharman CheekStafford, Stassi Fadely, MD 06/29/18 2025

## 2018-06-29 NOTE — ED Notes (Signed)
Pt c/o having positional numbness to the left arm over the past month with tingling pain.

## 2018-06-29 NOTE — Discharge Instructions (Addendum)
Your xrays do not show any broken bones in the left hand.  The small lump near your 3rd knuckle appears to be scar tissue in the tendon. Follow up with hand surgery for further evaluation of your hand tingling, which I suspect is due to carpal tunnel syndrome.

## 2018-06-29 NOTE — ED Notes (Signed)
MD Don PerkingVeronese verbal order for EKG at this time

## 2019-09-25 IMAGING — CR DG HAND COMPLETE 3+V*L*
1 series · 3 of 3 positions shown · non-contrast
Comparison: 12/31/2006

CLINICAL DATA: Tender right third metacarpal. Injury several weeks
ago

EXAM:
LEFT HAND - COMPLETE 3+ VIEW

[Series 1: x hand pa left · 0.14mm/px · 3 of 3 slices shown]
[im 1/3]
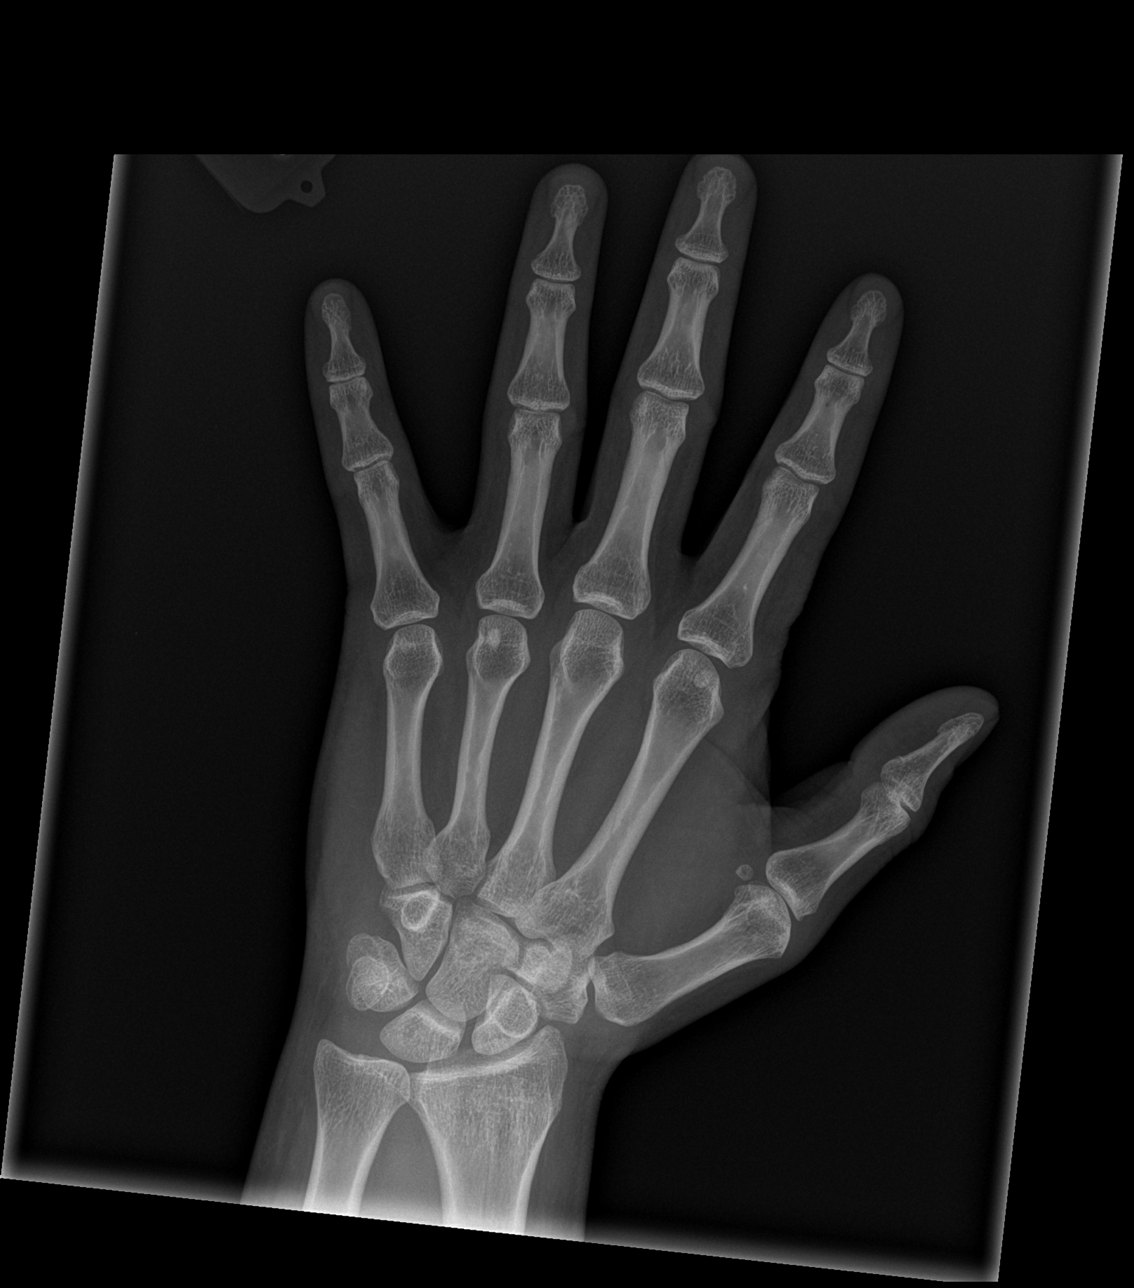
[im 2/3]
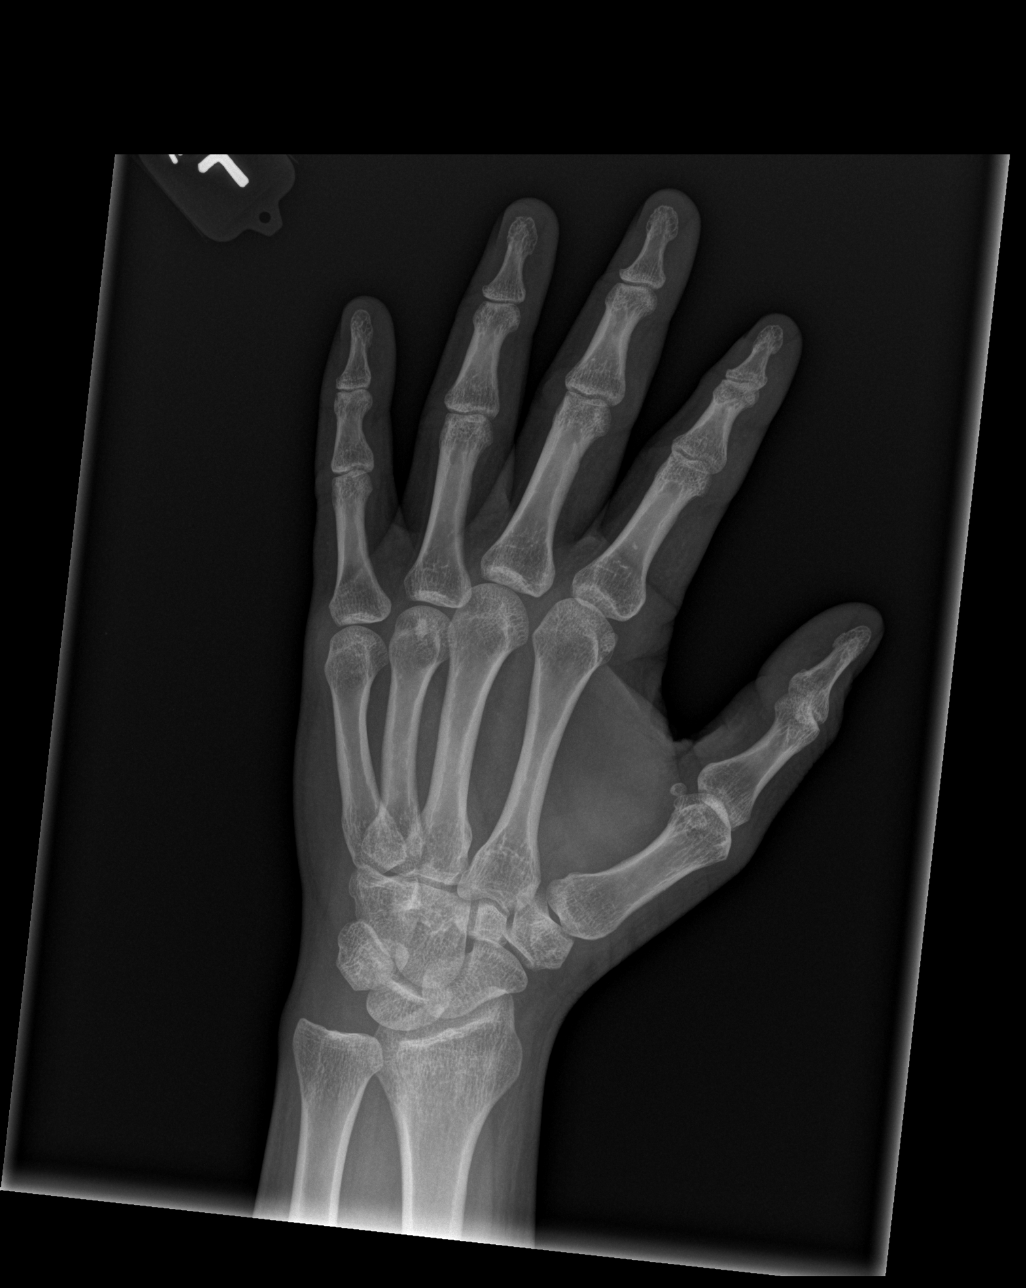
[im 3/3]
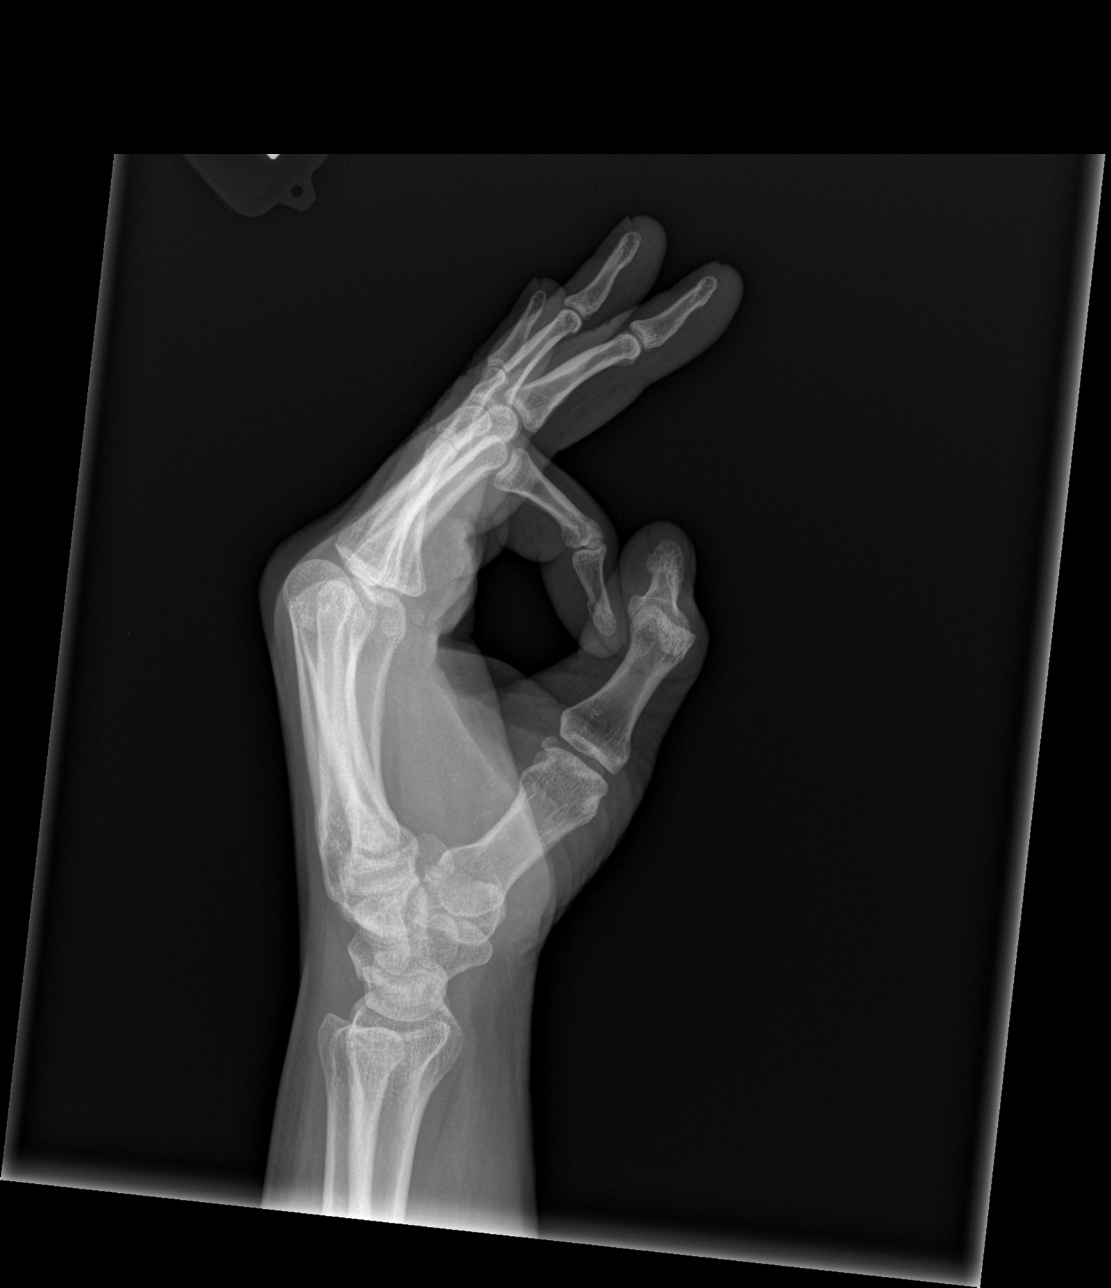

[3 of 3 positions shown; findings below may reference images not displayed]

FINDINGS: There is no evidence of fracture or dislocation. There is no
evidence of arthropathy or other focal bone abnormality. Soft
tissues are unremarkable.
IMPRESSION: Negative.

## 2020-09-24 ENCOUNTER — Encounter: Payer: Self-pay | Admitting: Emergency Medicine

## 2020-09-24 ENCOUNTER — Emergency Department
Admission: EM | Admit: 2020-09-24 | Discharge: 2020-09-24 | Disposition: A | Discharge status: Home / Self Care | Payer: No Typology Code available for payment source | Attending: Emergency Medicine | Admitting: Emergency Medicine

## 2020-09-24 ENCOUNTER — Emergency Department: Payer: No Typology Code available for payment source

## 2020-09-24 ENCOUNTER — Other Ambulatory Visit: Payer: Self-pay

## 2020-09-24 DIAGNOSIS — F17210 Nicotine dependence, cigarettes, uncomplicated: Secondary | ICD-10-CM | POA: Diagnosis not present

## 2020-09-24 DIAGNOSIS — K5792 Diverticulitis of intestine, part unspecified, without perforation or abscess without bleeding: Secondary | ICD-10-CM | POA: Diagnosis not present

## 2020-09-24 DIAGNOSIS — R109 Unspecified abdominal pain: Secondary | ICD-10-CM | POA: Diagnosis present

## 2020-09-24 LAB — URINALYSIS, COMPLETE (UACMP) WITH MICROSCOPIC
Bacteria, UA: NONE SEEN
Bilirubin Urine: NEGATIVE
Glucose, UA: NEGATIVE mg/dL
Ketones, ur: NEGATIVE mg/dL
Leukocytes,Ua: NEGATIVE
Nitrite: NEGATIVE
Protein, ur: NEGATIVE mg/dL
Specific Gravity, Urine: 1.018 (ref 1.005–1.030)
Squamous Epithelial / HPF: NONE SEEN (ref 0–5)
pH: 5 (ref 5.0–8.0)

## 2020-09-24 LAB — CBC
HCT: 42.5 % (ref 39.0–52.0)
Hemoglobin: 14 g/dL (ref 13.0–17.0)
MCH: 30.2 pg (ref 26.0–34.0)
MCHC: 32.9 g/dL (ref 30.0–36.0)
MCV: 91.8 fL (ref 80.0–100.0)
Platelets: 281 10*3/uL (ref 150–400)
RBC: 4.63 MIL/uL (ref 4.22–5.81)
RDW: 13.3 % (ref 11.5–15.5)
WBC: 14.1 10*3/uL — ABNORMAL HIGH (ref 4.0–10.5)
nRBC: 0 % (ref 0.0–0.2)

## 2020-09-24 LAB — COMPREHENSIVE METABOLIC PANEL
ALT: 16 U/L (ref 0–44)
AST: 14 U/L — ABNORMAL LOW (ref 15–41)
Albumin: 4.3 g/dL (ref 3.5–5.0)
Alkaline Phosphatase: 61 U/L (ref 38–126)
Anion gap: 9 (ref 5–15)
BUN: 14 mg/dL (ref 6–20)
CO2: 27 mmol/L (ref 22–32)
Calcium: 9.3 mg/dL (ref 8.9–10.3)
Chloride: 101 mmol/L (ref 98–111)
Creatinine, Ser: 0.98 mg/dL (ref 0.61–1.24)
GFR, Estimated: >60 mL/min (ref >60)
Glucose, Bld: 106 mg/dL — ABNORMAL HIGH (ref 70–99)
Potassium: 4.1 mmol/L (ref 3.5–5.1)
Sodium: 137 mmol/L (ref 135–145)
Total Bilirubin: 0.6 mg/dL (ref 0.3–1.2)
Total Protein: 7.4 g/dL (ref 6.5–8.1)

## 2020-09-24 LAB — LIPASE, BLOOD: Lipase: 38 U/L (ref 11–51)

## 2020-09-24 MED ORDER — IOHEXOL 300 MG/ML  SOLN
100.0000 mL | Freq: Once | INTRAMUSCULAR | Status: AC | PRN
  Administered 2020-09-24: 100 mL via INTRAVENOUS
  Filled 2020-09-24: qty 100

## 2020-09-24 MED ORDER — LACTATED RINGERS IV BOLUS
1000.0000 mL | Freq: Once | INTRAVENOUS | Status: AC
  Administered 2020-09-24: 1000 mL via INTRAVENOUS

## 2020-09-24 MED ORDER — MORPHINE SULFATE (PF) 4 MG/ML IV SOLN
4.0000 mg | Freq: Once | INTRAVENOUS | Status: AC
  Administered 2020-09-24: 4 mg via INTRAVENOUS
  Filled 2020-09-24: qty 1

## 2020-09-24 MED ORDER — METRONIDAZOLE 500 MG PO TABS
500.0000 mg | ORAL_TABLET | Freq: Once | ORAL | Status: AC
  Administered 2020-09-24: 500 mg via ORAL
  Filled 2020-09-24: qty 1

## 2020-09-24 MED ORDER — SULFAMETHOXAZOLE-TRIMETHOPRIM 800-160 MG PO TABS
1.0000 | ORAL_TABLET | Freq: Once | ORAL | Status: AC
  Administered 2020-09-24: 1 via ORAL
  Filled 2020-09-24: qty 1

## 2020-09-24 MED ORDER — METRONIDAZOLE 500 MG PO TABS: 500.0000 mg | ORAL_TABLET | Freq: Three times a day (TID) | ORAL | 0 refills | Status: AC

## 2020-09-24 MED ORDER — SULFAMETHOXAZOLE-TRIMETHOPRIM 800-160 MG PO TABS: 1.0000 | ORAL_TABLET | Freq: Two times a day (BID) | ORAL | 0 refills | Status: AC

## 2020-09-24 NOTE — ED Triage Notes (Signed)
Pt to ER with c/o lower abdominal pain for last 2 days.  States notes pain when he rolls over in bed or when he bends over.  Pt also states pain when urinating and having BM.  PT denies n/v/d.  States notes pain mostly with movement and using BR.

## 2020-09-24 NOTE — ED Provider Notes (Signed)
[facility] Emergency Department Provider Note   ____________________________________________   First MD Initiated Contact with Patient 09/24/20 1600     (approximate)  I have reviewed the triage vital signs and the nursing notes.   HISTORY  Chief Complaint Abdominal Pain    HPI William Hardin. is a 39 y.o. male with past medical history of seizures who presents to the ED complaining of abdominal pain.  Patient reports he has had constant bilateral lower quadrant abdominal pain for the past 2 days.  He describes it as constant but worse with movement and certain positions.  He also has worsening pain when he goes to urinate or have a bowel movement.  He denies any changes in his bowel movements, does state he has been urinating more frequently than usual.  He denies any dysuria or hematuria, also denies any nausea or vomiting.  He has not had any fevers, denies any cough, chest pain, or shortness of breath.        Past Medical History:  Diagnosis Date  . Seizures (HCC)     There are no problems to display for this patient.   History reviewed. No pertinent surgical history.  Prior to Admission medications   Medication Sig Start Date End Date Taking? Authorizing Provider  albuterol (PROVENTIL HFA;VENTOLIN HFA) 108 (90 Base) MCG/ACT inhaler Inhale 2 puffs into the lungs every 6 (six) hours as needed for wheezing or shortness of breath. 05/27/17   Willy Eddy, MD  cetirizine (ZYRTEC) 10 MG tablet Take 1 tablet (10 mg total) by mouth daily. 12/10/16   Cuthriell, Delorise Royals, PA-C  metroNIDAZOLE (FLAGYL) 500 MG tablet Take 1 tablet (500 mg total) by mouth 3 (three) times daily for 10 days. 09/24/20 10/04/20  Chesley Noon, MD  predniSONE (DELTASONE) 20 MG tablet Take 2 tablets by mouth, once daily, for 5 days 12/20/16   Hagler, Jami L, PA-C  sulfamethoxazole-trimethoprim (BACTRIM DS) 800-160 MG tablet Take 1 tablet by mouth 2 (two) times daily for 10  days. 09/24/20 10/04/20  Chesley Noon, MD    Allergies Amoxicillin, Aspirin, Codeine, and Penicillins  History reviewed. No pertinent family history.  Social History Social History   Tobacco Use  . Smoking status: Current Every Day Smoker    Packs/day: 0.50    Types: Cigarettes  . Smokeless tobacco: Never Used  Substance Use Topics  . Alcohol use: No  . Drug use: No    Review of Systems  Constitutional: No fever/chills Eyes: No visual changes. ENT: No sore throat. Cardiovascular: Denies chest pain. Respiratory: Denies shortness of breath. Gastrointestinal: Positive for abdominal pain.  No nausea, no vomiting.  No diarrhea.  No constipation. Genitourinary: Negative for dysuria.  Positive for urinary frequency. Musculoskeletal: Negative for back pain. Skin: Negative for rash. Neurological: Negative for headaches, focal weakness or numbness.  ____________________________________________   PHYSICAL EXAM:  VITAL SIGNS: ED Triage Vitals  Enc Vitals Group     BP 09/24/20 1127 113/75     Pulse Rate 09/24/20 1127 81     Resp 09/24/20 1127 18     Temp 09/24/20 1127 98.3 F (36.8 C)     Temp Source 09/24/20 1127 Oral     SpO2 09/24/20 1127 97 %     Weight 09/24/20 1128 215 lb (97.5 kg)     Height 09/24/20 1128 5\' 8"  (1.727 m)     Head Circumference --      Peak Flow --      Pain  Score 09/24/20 1127 8     Pain Loc --      Pain Edu? --      Excl. in GC? --     Constitutional: Alert and oriented. Eyes: Conjunctivae are normal. Head: Atraumatic. Nose: No congestion/rhinnorhea. Mouth/Throat: Mucous membranes are moist. Neck: Normal ROM Cardiovascular: Normal rate, regular rhythm. Grossly normal heart sounds. Respiratory: Normal respiratory effort.  No retractions. Lungs CTAB. Gastrointestinal: Soft and tender to palpation in the bilateral lower quadrants, right greater than left, with no rebound or guarding. No distention. Genitourinary:  deferred Musculoskeletal: No lower extremity tenderness nor edema. Neurologic:  Normal speech and language. No gross focal neurologic deficits are appreciated. Skin:  Skin is warm, dry and intact. No rash noted. Psychiatric: Mood and affect are normal. Speech and behavior are normal.  ____________________________________________   LABS (all labs ordered are listed, but only abnormal results are displayed)  Labs Reviewed  COMPREHENSIVE METABOLIC PANEL - Abnormal; Notable for the following components:      Result Value   Glucose, Bld 106 (*)    AST 14 (*)    All other components within normal limits  CBC - Abnormal; Notable for the following components:   WBC 14.1 (*)    All other components within normal limits  URINALYSIS, COMPLETE (UACMP) WITH MICROSCOPIC - Abnormal; Notable for the following components:   Color, Urine YELLOW (*)    APPearance CLEAR (*)    Hgb urine dipstick SMALL (*)    All other components within normal limits  LIPASE, BLOOD    PROCEDURES  Procedure(s) performed (including Critical Care):  Procedures   ____________________________________________   INITIAL IMPRESSION / ASSESSMENT AND PLAN / ED COURSE       39 year old male with childhood seizures, not currently on any medications, presents to the ED with 2 days of constant bilateral lower quadrant abdominal pain.  He has tenderness greater in the right lower quadrant than the left and we will further assess for appendicitis with CT scan.  He does have some urinary symptoms but UA shows no evidence of infection.  Lab work is reassuring outside of a mild leukocytosis.  We will treat pain with IV morphine and hydrate patient with IV fluids, he currently declines any nausea medicine.  CT scan is consistent with an uncomplicated diverticulitis.  Patient feeling much better following IV morphine and he was able to tolerate initial dose of oral antibiotics without difficulty.  He is appropriate for discharge  home with course of Bactrim and Flagyl, was counseled to establish care with PCP and return to the ED for new worsening symptoms.  Patient agrees with plan.      ____________________________________________   FINAL CLINICAL IMPRESSION(S) / ED DIAGNOSES  Final diagnoses:  Diverticulitis     ED Discharge Orders         Ordered    sulfamethoxazole-trimethoprim (BACTRIM DS) 800-160 MG tablet  2 times daily        09/24/20 1914    metroNIDAZOLE (FLAGYL) 500 MG tablet  3 times daily        09/24/20 1914           Note:  This document was prepared using Dragon voice recognition software and may include unintentional dictation errors.   Chesley Noon, MD 09/24/20 [phone]

## 2020-12-20 ENCOUNTER — Emergency Department
Admission: EM | Admit: 2020-12-20 | Discharge: 2020-12-20 | Disposition: A | Discharge status: Home / Self Care | Payer: No Typology Code available for payment source | Attending: Emergency Medicine | Admitting: Emergency Medicine

## 2020-12-20 ENCOUNTER — Other Ambulatory Visit: Payer: Self-pay

## 2020-12-20 DIAGNOSIS — F17210 Nicotine dependence, cigarettes, uncomplicated: Secondary | ICD-10-CM | POA: Insufficient documentation

## 2020-12-20 DIAGNOSIS — U071 COVID-19: Secondary | ICD-10-CM | POA: Diagnosis not present

## 2020-12-20 DIAGNOSIS — Z20822 Contact with and (suspected) exposure to covid-19: Secondary | ICD-10-CM

## 2020-12-20 DIAGNOSIS — R0981 Nasal congestion: Secondary | ICD-10-CM | POA: Diagnosis present

## 2020-12-20 DIAGNOSIS — J069 Acute upper respiratory infection, unspecified: Secondary | ICD-10-CM

## 2020-12-20 DIAGNOSIS — Z716 Tobacco abuse counseling: Secondary | ICD-10-CM | POA: Diagnosis not present

## 2020-12-20 LAB — SARS CORONAVIRUS 2 (TAT 6-24 HRS): SARS Coronavirus 2: POSITIVE — AB

## 2020-12-20 NOTE — ED Triage Notes (Signed)
Pt comes into the ED via POV c/o generalized body aches, sore throat, and exposure to someone who is COVID positive.  Pt has even and unlabored respirations.

## 2020-12-20 NOTE — ED Provider Notes (Signed)
[facility] Emergency Department Provider Note  ____________________________________________   Event Date/Time   First MD Initiated Contact with Patient 12/20/20 1317     (approximate)  I have reviewed the triage vital signs and the nursing notes.   HISTORY  Chief Complaint Sore Throat and Generalized Body Aches   HPI William Hardin. is a 40 y.o. male with past medical history of seizure disorder and tobacco abuse who presents for assessment of myalgias associate with scratchy throat congestion and headache as well as fatigue and decreased appetite that began yesterday.  Patient states he has been exposed to several people at home who have tested positive for COVID.  He denies any measured fevers, shortness of breath, chest pain, back pain, earache, eye pain, rash, extremity pain, urinary symptoms but does endorse some looser stools than usual.  No clear focal abdominal pain but patient does endorse generalized myalgias in all extremities and his torso.  No clear alleviating or aggravating factors including some Tylenol he took yesterday.  Patient denies EtOH or illicit drug use.         Past Medical History:  Diagnosis Date  . Seizures (HCC)     There are no problems to display for this patient.   No past surgical history on file.  Prior to Admission medications   Medication Sig Start Date End Date Taking? Authorizing Provider  albuterol (PROVENTIL HFA;VENTOLIN HFA) 108 (90 Base) MCG/ACT inhaler Inhale 2 puffs into the lungs every 6 (six) hours as needed for wheezing or shortness of breath. 05/27/17   Willy Eddy, MD  cetirizine (ZYRTEC) 10 MG tablet Take 1 tablet (10 mg total) by mouth daily. 12/10/16   Cuthriell, Delorise Royals, PA-C  predniSONE (DELTASONE) 20 MG tablet Take 2 tablets by mouth, once daily, for 5 days 12/20/16   Hagler, Jami L, PA-C    Allergies Amoxicillin, Aspirin, Codeine, and Penicillins  No family history on  file.  Social History Social History   Tobacco Use  . Smoking status: Current Every Day Smoker    Packs/day: 0.50    Types: Cigarettes  . Smokeless tobacco: Never Used  Substance Use Topics  . Alcohol use: No  . Drug use: No    Review of Systems  Review of Systems  Constitutional: Positive for chills and malaise/fatigue. Negative for fever.  HENT: Positive for congestion and sore throat.   Eyes: Negative for pain.  Respiratory: Positive for cough. Negative for stridor.   Cardiovascular: Negative for chest pain.  Gastrointestinal: Positive for diarrhea. Negative for vomiting.  Genitourinary: Negative for dysuria.  Musculoskeletal: Positive for myalgias.  Skin: Negative for rash.  Neurological: Positive for headaches. Negative for seizures and loss of consciousness.  Psychiatric/Behavioral: Negative for suicidal ideas.  All other systems reviewed and are negative.     ____________________________________________   PHYSICAL EXAM:  VITAL SIGNS: ED Triage Vitals  Enc Vitals Group     BP 12/20/20 1303 117/83     Pulse Rate 12/20/20 1303 85     Resp 12/20/20 1303 18     Temp 12/20/20 1303 99 F (37.2 C)     Temp Source 12/20/20 1303 Oral     SpO2 12/20/20 1303 97 %     Weight 12/20/20 1255 205 lb (93 kg)     Height 12/20/20 1255 5\' 7"  (1.702 m)     Head Circumference --      Peak Flow --      Pain Score 12/20/20 1255 0  Pain Loc --      Pain Edu? --      Excl. in GC? --    Vitals:   12/20/20 1303  BP: 117/83  Pulse: 85  Resp: 18  Temp: 99 F (37.2 C)  SpO2: 97%   Physical Exam Vitals and nursing note reviewed.  Constitutional:      General: He is not in acute distress.    Appearance: He is well-developed and well-nourished.  HENT:     Head: Normocephalic and atraumatic.     Right Ear: External ear normal.     Left Ear: External ear normal.     Nose: Nose normal.     Mouth/Throat:     Mouth: Mucous membranes are moist.     Pharynx: Oropharynx  is clear. No oropharyngeal exudate.     Tonsils: No tonsillar exudate or tonsillar abscesses.  Eyes:     Conjunctiva/sclera: Conjunctivae normal.  Cardiovascular:     Rate and Rhythm: Normal rate and regular rhythm.     Pulses: Normal pulses.     Heart sounds: No murmur heard.   Pulmonary:     Effort: Pulmonary effort is normal. No respiratory distress.     Breath sounds: Normal breath sounds.  Abdominal:     Palpations: Abdomen is soft.     Tenderness: There is no abdominal tenderness. There is no right CVA tenderness or left CVA tenderness.  Musculoskeletal:        General: No edema.     Cervical back: Normal range of motion and neck supple. No rigidity.     Right lower leg: No edema.     Left lower leg: No edema.  Skin:    General: Skin is warm and dry.     Capillary Refill: Capillary refill takes less than 2 seconds.  Neurological:     Mental Status: He is alert and oriented to person, place, and time.  Psychiatric:        Mood and Affect: Mood and affect and mood normal.      ____________________________________________   LABS (all labs ordered are listed, but only abnormal results are displayed)  Labs Reviewed  SARS CORONAVIRUS 2 (TAT 6-24 HRS)   ____________________________________________   ____________________________________________   PROCEDURES  Procedure(s) performed (including Critical Care):  Procedures   ____________________________________________   INITIAL IMPRESSION / ASSESSMENT AND PLAN / ED COURSE     Patient presents with above-stated history exam for assessment of scratchy throat, congestion, headache, slight nonproductive cough and myalgias described above.  He is afebrile and hemodynamically stable on arrival.  Pression is likely viral syndrome with COVID-19 versus other respiratory viruses on the differential.  No evidence on history or exam of deep space infection in the head or neck.  No fever or abnormal breath sounds to suggest  acute bacterial pneumonia.  Patient has no evidence on exam of meningitis do not believe he is septic at this time.  Isolation precautions and tobacco cessation discussed.   ____________________________________________   FINAL CLINICAL IMPRESSION(S) / ED DIAGNOSES  Final diagnoses:  Upper respiratory tract infection, unspecified type  Person under investigation for COVID-19  Encounter for tobacco use cessation counseling    Medications - No data to display   ED Discharge Orders    None       Note:  This document was prepared using Dragon voice recognition software and may include unintentional dictation errors.   Gilles Chiquito, MD 12/20/20 1425

## 2020-12-20 NOTE — ED Notes (Signed)
Pt states coming in because of not feeling well that started yesterday. Pt states generalized body discomfort, but denies pain. Pt states family tested positive for covid-19

## 2020-12-21 ENCOUNTER — Telehealth: Payer: Self-pay

## 2020-12-21 NOTE — Telephone Encounter (Signed)
William Hardin called and patient gave permission for me to give COVID-19 result to [facility].  She was informed that patients COVID-19 test was positive and he can pass the germ to others..  She states that he has lost his voice and can not smell or taste. He is weak.  Symptom tiers and treatment plan for each read to [facility].  Criteria for ending isolation were read to betty 5 days and no fever for 24 hours and no fever reducing medication used. Respiratory symptoms improved.  Then 5 days with mask at all times when with others. Good preventative practices were discussed. She verbalized understanding of all information. [facility] will be be notified

## 2021-01-16 ENCOUNTER — Emergency Department: Payer: No Typology Code available for payment source

## 2021-01-16 ENCOUNTER — Emergency Department
Admission: EM | Admit: 2021-01-16 | Discharge: 2021-01-16 | Disposition: A | Discharge status: Home / Self Care | Payer: No Typology Code available for payment source | Attending: Emergency Medicine | Admitting: Emergency Medicine

## 2021-01-16 ENCOUNTER — Other Ambulatory Visit: Payer: Self-pay

## 2021-01-16 DIAGNOSIS — F17210 Nicotine dependence, cigarettes, uncomplicated: Secondary | ICD-10-CM | POA: Diagnosis not present

## 2021-01-16 DIAGNOSIS — R1032 Left lower quadrant pain: Secondary | ICD-10-CM | POA: Diagnosis present

## 2021-01-16 DIAGNOSIS — K5732 Diverticulitis of large intestine without perforation or abscess without bleeding: Secondary | ICD-10-CM | POA: Diagnosis not present

## 2021-01-16 DIAGNOSIS — K5792 Diverticulitis of intestine, part unspecified, without perforation or abscess without bleeding: Secondary | ICD-10-CM

## 2021-01-16 LAB — COMPREHENSIVE METABOLIC PANEL
ALT: 23 U/L (ref 0–44)
AST: 17 U/L (ref 15–41)
Albumin: 4.3 g/dL (ref 3.5–5.0)
Alkaline Phosphatase: 53 U/L (ref 38–126)
Anion gap: 9 (ref 5–15)
BUN: 11 mg/dL (ref 6–20)
CO2: 26 mmol/L (ref 22–32)
Calcium: 9.1 mg/dL (ref 8.9–10.3)
Chloride: 102 mmol/L (ref 98–111)
Creatinine, Ser: 0.82 mg/dL (ref 0.61–1.24)
GFR, Estimated: >60 mL/min (ref >60)
Glucose, Bld: 132 mg/dL — ABNORMAL HIGH (ref 70–99)
Potassium: 3.4 mmol/L — ABNORMAL LOW (ref 3.5–5.1)
Sodium: 137 mmol/L (ref 135–145)
Total Bilirubin: 0.9 mg/dL (ref 0.3–1.2)
Total Protein: 7.8 g/dL (ref 6.5–8.1)

## 2021-01-16 LAB — URINALYSIS, COMPLETE (UACMP) WITH MICROSCOPIC
Bacteria, UA: NONE SEEN
Bilirubin Urine: NEGATIVE
Glucose, UA: NEGATIVE mg/dL
Hgb urine dipstick: NEGATIVE
Ketones, ur: NEGATIVE mg/dL
Leukocytes,Ua: NEGATIVE
Nitrite: NEGATIVE
Protein, ur: NEGATIVE mg/dL
Specific Gravity, Urine: 1.023 (ref 1.005–1.030)
Squamous Epithelial / HPF: NONE SEEN (ref 0–5)
pH: 6 (ref 5.0–8.0)

## 2021-01-16 LAB — CBC
HCT: 38.1 % — ABNORMAL LOW (ref 39.0–52.0)
Hemoglobin: 13.3 g/dL (ref 13.0–17.0)
MCH: 31.3 pg (ref 26.0–34.0)
MCHC: 34.9 g/dL (ref 30.0–36.0)
MCV: 89.6 fL (ref 80.0–100.0)
Platelets: 258 10*3/uL (ref 150–400)
RBC: 4.25 MIL/uL (ref 4.22–5.81)
RDW: 13.2 % (ref 11.5–15.5)
WBC: 12.8 10*3/uL — ABNORMAL HIGH (ref 4.0–10.5)
nRBC: 0 % (ref 0.0–0.2)

## 2021-01-16 LAB — LIPASE, BLOOD: Lipase: 47 U/L (ref 11–51)

## 2021-01-16 MED ORDER — ONDANSETRON HCL 4 MG/2ML IJ SOLN
4.0000 mg | Freq: Once | INTRAMUSCULAR | Status: AC
  Administered 2021-01-16: 4 mg via INTRAVENOUS
  Filled 2021-01-16: qty 2

## 2021-01-16 MED ORDER — METRONIDAZOLE 500 MG PO TABS: 500.0000 mg | ORAL_TABLET | Freq: Three times a day (TID) | ORAL | 0 refills | Status: AC

## 2021-01-16 MED ORDER — IOHEXOL 9 MG/ML PO SOLN
1000.0000 mL | Freq: Once | ORAL | Status: DC | PRN
  Filled 2021-01-16: qty 1000

## 2021-01-16 MED ORDER — IOHEXOL 300 MG/ML  SOLN
100.0000 mL | Freq: Once | INTRAMUSCULAR | Status: AC | PRN
  Administered 2021-01-16: 100 mL via INTRAVENOUS
  Filled 2021-01-16: qty 100

## 2021-01-16 MED ORDER — FENTANYL CITRATE (PF) 100 MCG/2ML IJ SOLN
50.0000 ug | Freq: Once | INTRAMUSCULAR | Status: AC
  Administered 2021-01-16: 50 ug via INTRAVENOUS
  Filled 2021-01-16: qty 2

## 2021-01-16 MED ORDER — SODIUM CHLORIDE 0.9 % IV BOLUS
1000.0000 mL | Freq: Once | INTRAVENOUS | Status: AC
  Administered 2021-01-16: 1000 mL via INTRAVENOUS

## 2021-01-16 MED ORDER — CIPROFLOXACIN HCL 500 MG PO TABS: 500.0000 mg | ORAL_TABLET | Freq: Two times a day (BID) | ORAL | 0 refills | Status: AC

## 2021-01-16 NOTE — ED Provider Notes (Signed)
[facility] Emergency Department Provider Note  Time seen: 4:48 PM  I have reviewed the triage vital signs and the nursing notes.   HISTORY  Chief Complaint Abdominal Pain    HPI William Hardin. is a 40 y.o. male with a past medical history  of diverticulitis presents to the emergency department for left lower quadrant abdominal pain.  According to the patient approximately 2 to 3 months ago he was diagnosed with diverticulitis completed a course of antibiotics and felt better.  He states over the past 2 to 3 days he has begun experiencing pain in left lower quadrant of his abdomen once again.  Described as a mild to moderate dull pain worse with movement.  He does state it is somewhat reminiscent of diverticulitis experienced several months ago.  Patient denies any fever nausea or vomiting but has had loose stool at times.  No black or bloody stool.  No urinary symptoms.  Past Medical History:  Diagnosis Date  . Seizures (HCC)     There are no problems to display for this patient.   History reviewed. No pertinent surgical history.  Prior to Admission medications   Medication Sig Start Date End Date Taking? Authorizing Provider  albuterol (PROVENTIL HFA;VENTOLIN HFA) 108 (90 Base) MCG/ACT inhaler Inhale 2 puffs into the lungs every 6 (six) hours as needed for wheezing or shortness of breath. 05/27/17   Willy Eddy, MD  cetirizine (ZYRTEC) 10 MG tablet Take 1 tablet (10 mg total) by mouth daily. 12/10/16   Cuthriell, Delorise Royals, PA-C  predniSONE (DELTASONE) 20 MG tablet Take 2 tablets by mouth, once daily, for 5 days 12/20/16   Hagler, Jami L, PA-C    Allergies  Allergen Reactions  . Amoxicillin   . Aspirin   . Codeine   . Penicillins     No family history on file.  Social History Social History   Tobacco Use  . Smoking status: Current Every Day Smoker    Packs/day: 0.50    Types: Cigarettes  . Smokeless tobacco: Never Used  Substance Use  Topics  . Alcohol use: No  . Drug use: No    Review of Systems Constitutional: Negative for fever Cardiovascular: Negative for chest pain. Respiratory: Negative for shortness of breath. Gastrointestinal: Moderate left lower quadrant abdominal discomfort.  Positive for loose stool.  Negative for vomiting. Genitourinary: Negative for urinary compaints Musculoskeletal: Negative for musculoskeletal complaints Neurological: Negative for headache All other ROS negative  ____________________________________________   PHYSICAL EXAM:  VITAL SIGNS: ED Triage Vitals  Enc Vitals Group     BP 01/16/21 1401 128/88     Pulse Rate 01/16/21 1401 88     Resp 01/16/21 1401 18     Temp 01/16/21 1401 98.3 F (36.8 C)     Temp Source 01/16/21 1401 Oral     SpO2 01/16/21 1401 95 %     Weight 01/16/21 1402 219 lb (99.3 kg)     Height 01/16/21 1402 5\' 8"  (1.727 m)     Head Circumference --      Peak Flow --      Pain Score 01/16/21 1402 6     Pain Loc --      Pain Edu? --      Excl. in GC? --     Constitutional: Alert and oriented. Well appearing and in no distress. Eyes: Normal exam ENT      Head: Normocephalic and atraumatic.      Mouth/Throat: Mucous  membranes are moist. Cardiovascular: Normal rate, regular rhythm. No murmu Respiratory: Normal respiratory effort without tachypnea nor retractions. Breath sounds are clear Gastrointestinal: Soft, mild left lower quadrant and suprapubic tenderness palpation without rebound guarding or distention. Musculoskeletal: Nontender with normal range of motion in all extremities.  Neurologic:  Normal speech and language. No gross focal neurologic deficits Skin:  Skin is warm, dry and intact.  Psychiatric: Mood and affect are normal.   ____________________________________________     RADIOLOGY  CT scan consistent with sigmoid diverticulitis.  ____________________________________________   INITIAL IMPRESSION / ASSESSMENT AND PLAN / ED  COURSE  Pertinent labs & imaging results that were available during my care of the patient were reviewed by me and considered in my medical decision making (see chart for details).   Patient presents emergency department for left lower quadrant abdominal pain over the past 2 to 3 days with loose stool.  States the pain is worse with movement.  Patient has moderate tenderness to this area on abdominal exam otherwise benign abdomen without rebound guarding or distention.  Patient's labs do show a very slight leukocytosis otherwise largely within normal limits.  Given the mild leukocytosis abdominal tenderness and history of diverticulitis previously we will proceed with CT imaging to rule out recurrent diverticulitis perforation or abscess.  Patient agreeable to plan of care.  We will treat pain nausea and IV hydrate with awaiting results.  CT is consistent with sigmoid diverticulitis in the same area as 09/24/2020.  We will treat with antibiotics and pain medication.  I did discuss with patient GI follow-up for colonoscopy given this is recurrent in the same area as well as general surgery follow-up to discuss possible elective partial colectomy if deemed necessary.  Patient agreeable to plan of care  [name]. was evaluated in Emergency Department on 01/16/2021 for the symptoms described in the history of present illness. He was evaluated in the context of the global COVID-19 pandemic, which necessitated consideration that the patient might be at risk for infection with the SARS-CoV-2 virus that causes COVID-19. Institutional protocols and algorithms that pertain to the evaluation of patients at risk for COVID-19 are in a state of rapid change based on information released by regulatory bodies including the CDC and federal and state organizations. These policies and algorithms were followed during the patient's care in the ED.  ____________________________________________   FINAL CLINICAL  IMPRESSION(S) / ED DIAGNOSES  Left lower quadrant abdominal pain Diverticulitis   Minna Antis, MD 01/16/21 [phone]

## 2021-01-16 NOTE — ED Notes (Signed)
Patient transported to CT 

## 2021-01-16 NOTE — ED Triage Notes (Signed)
Pt to ED POV for chief complaint of lower abdominal pain, diarrhea that started yesterday.  States recently dx with diverticulitis and thinks it is coming back.  Ambulatory to triage, NAD noted

## 2023-01-16 ENCOUNTER — Other Ambulatory Visit: Payer: Self-pay

## 2023-01-16 ENCOUNTER — Emergency Department: Payer: Self-pay

## 2023-01-16 ENCOUNTER — Emergency Department
Admission: EM | Admit: 2023-01-16 | Discharge: 2023-01-16 | Disposition: A | Discharge status: Home / Self Care | Payer: Self-pay | Attending: Emergency Medicine | Admitting: Emergency Medicine

## 2023-01-16 DIAGNOSIS — R531 Weakness: Secondary | ICD-10-CM | POA: Insufficient documentation

## 2023-01-16 DIAGNOSIS — Z20822 Contact with and (suspected) exposure to covid-19: Secondary | ICD-10-CM | POA: Insufficient documentation

## 2023-01-16 LAB — COMPREHENSIVE METABOLIC PANEL
ALT: 13 U/L (ref 0–44)
AST: 13 U/L — ABNORMAL LOW (ref 15–41)
Albumin: 3.9 g/dL (ref 3.5–5.0)
Alkaline Phosphatase: 49 U/L (ref 38–126)
Anion gap: 6 (ref 5–15)
BUN: 16 mg/dL (ref 6–20)
CO2: 28 mmol/L (ref 22–32)
Calcium: 8.5 mg/dL — ABNORMAL LOW (ref 8.9–10.3)
Chloride: 102 mmol/L (ref 98–111)
Creatinine, Ser: 1.17 mg/dL (ref 0.61–1.24)
GFR, Estimated: >60 mL/min (ref >60)
Glucose, Bld: 97 mg/dL (ref 70–99)
Potassium: 3.4 mmol/L — ABNORMAL LOW (ref 3.5–5.1)
Sodium: 136 mmol/L (ref 135–145)
Total Bilirubin: 0.6 mg/dL (ref 0.3–1.2)
Total Protein: 7 g/dL (ref 6.5–8.1)

## 2023-01-16 LAB — MAGNESIUM: Magnesium: 2.4 mg/dL (ref 1.7–2.4)

## 2023-01-16 LAB — CBC WITH DIFFERENTIAL/PLATELET
Abs Immature Granulocytes: 0.02 10*3/uL (ref 0.00–0.07)
Basophils Absolute: 0.1 10*3/uL (ref 0.0–0.1)
Basophils Relative: 1 %
Eosinophils Absolute: 0.2 10*3/uL (ref 0.0–0.5)
Eosinophils Relative: 2 %
HCT: 40.7 % (ref 39.0–52.0)
Hemoglobin: 13.7 g/dL (ref 13.0–17.0)
Immature Granulocytes: 0 %
Lymphocytes Relative: 32 %
Lymphs Abs: 3.2 10*3/uL (ref 0.7–4.0)
MCH: 30.3 pg (ref 26.0–34.0)
MCHC: 33.7 g/dL (ref 30.0–36.0)
MCV: 90 fL (ref 80.0–100.0)
Monocytes Absolute: 0.9 10*3/uL (ref 0.1–1.0)
Monocytes Relative: 9 %
Neutro Abs: 5.8 10*3/uL (ref 1.7–7.7)
Neutrophils Relative %: 56 %
Platelets: 272 10*3/uL (ref 150–400)
RBC: 4.52 MIL/uL (ref 4.22–5.81)
RDW: 12.6 % (ref 11.5–15.5)
WBC: 10.2 10*3/uL (ref 4.0–10.5)
nRBC: 0 % (ref 0.0–0.2)

## 2023-01-16 LAB — RESP PANEL BY RT-PCR (RSV, FLU A&B, COVID)  RVPGX2
Influenza A by PCR: NEGATIVE
Influenza B by PCR: NEGATIVE
Resp Syncytial Virus by PCR: NEGATIVE
SARS Coronavirus 2 by RT PCR: NEGATIVE

## 2023-01-16 LAB — TROPONIN I (HIGH SENSITIVITY)
Troponin I (High Sensitivity): 3 ng/L (ref <18)
Troponin I (High Sensitivity): 3 ng/L (ref <18)

## 2023-01-16 NOTE — ED Triage Notes (Signed)
Generalized weakness beginning 4 hours ago while on phone talking to a friend. Says he just feels fatigued and sluggish.   Admits to mild central chest pains worse when laying flat or when taking a deep breath.

## 2023-01-16 NOTE — ED Provider Notes (Signed)
[facility] Provider Note    Event Date/Time   First MD Initiated Contact with Patient 01/16/23 0016     (approximate)   History   Weakness   HPI  William Luman. is a 42 y.o. male who presents to the ED for evaluation of Weakness   Patient presents to the ED for evaluation of a few hours of fatigue and generalized weakness.  Reports he had a "normal" day at work today but on his way home from work he began feeling generalized fatigue and weakness.  Reports his whole body feels heavy and weak.  No focal weakness, falls, syncope or injuries.  Reports intermittent chest heaviness over the past few hours, but none right now.   Physical Exam   Triage Vital Signs: ED Triage Vitals [01/16/23 0011]  Enc Vitals Group     BP (!) 135/101     Pulse Rate 73     Resp 16     Temp 98.1 F (36.7 C)     Temp src      SpO2 97 %     Weight 215 lb (97.5 kg)     Height 5' 8"$  (1.727 m)     Head Circumference      Peak Flow      Pain Score 4     Pain Loc      Pain Edu?      Excl. in [place]?     Most recent vital signs: Vitals:   01/16/23 0056 01/16/23 0100  BP:  123/83  Pulse: 67 61  Resp: 20 15  Temp:    SpO2: 98% 98%    General: Awake, no distress.  Ambulatory independently with a normal gait. CV:  Good peripheral perfusion.  Resp:  Normal effort.  Abd:  No distention. Soft and benign MSK:  No deformity noted.  Neuro:  No focal deficits appreciated. Cranial nerves II through XII intact 5/5 strength and sensation in all 4 extremities Other:     ED Results / Procedures / Treatments   Labs (all labs ordered are listed, but only abnormal results are displayed) Labs Reviewed  COMPREHENSIVE METABOLIC PANEL - Abnormal; Notable for the following components:      Result Value   Potassium 3.4 (*)    Calcium 8.5 (*)    AST 13 (*)    All other components within normal limits  RESP PANEL BY RT-PCR (RSV, FLU A&B, COVID)  RVPGX2  CBC WITH  DIFFERENTIAL/PLATELET  MAGNESIUM  URINALYSIS, ROUTINE W REFLEX MICROSCOPIC  TROPONIN I (HIGH SENSITIVITY)  TROPONIN I (HIGH SENSITIVITY)    EKG Sinus rhythm at a rate of 65 bpm.  Normal axis and intervals.  No for signs of acute ischemia.  T wave inversions to lead III and biphasic to aVF.  RADIOLOGY CXR interpreted by me without evidence of acute cardiopulmonary pathology.  Official radiology report(s): DG Chest 2 View  Result Date: 01/16/2023 CLINICAL DATA:  Chest pain, shortness of breath EXAM: CHEST - 2 VIEW COMPARISON:  05/27/2017 FINDINGS: The heart size and mediastinal contours are within normal limits. Both lungs are clear. The visualized skeletal structures are unremarkable. IMPRESSION: No active cardiopulmonary disease. Electronically Signed   By: Rolm Baptise M.D.   On: 01/16/2023 00:52    PROCEDURES and INTERVENTIONS:  .1-3 Lead EKG Interpretation  Performed by: Vladimir Crofts, MD Authorized by: Vladimir Crofts, MD     Interpretation: normal     ECG rate:  60  ECG rate assessment: normal     Rhythm: sinus rhythm     Ectopy: none     Conduction: normal     Medications - No data to display   IMPRESSION / MDM / ASSESSMENT AND PLAN / ED COURSE  I reviewed the triage vital signs and the nursing notes.  Differential diagnosis includes, but is not limited to, ACS, PTX, PNA, muscle strain/spasm, PE, dissection, viral syndrome  {Patient presents with symptoms of an acute illness or injury that is potentially life-threatening.  42 year old male presents to the ED with vague generalized weakness over the past few hours, without evidence of acute pathology and suitable for outpatient management.  He looks medically well and has normal vital signs.  Ambulatory independently without evidence of any neurologic deficits or any focal neurologic deficits.  Serum workup is benign with normal CBC and essentially normal metabolic panel.  Negative viral swabs and 2 negative troponins.  I  considered observation admission for this patient, but ultimately I think a trial of outpatient management is reasonable  Clinical Course as of 01/16/23 0310  Fri Jan 16, 2023  0302 Reassessed.  Patient reports feeling little bit better.  We discussed reassuring workup and possible etiologies of his symptoms.  He reaffirms he has had no urinary symptoms.  Uncomfortable with him getting out of here without a urinalysis.  We discussed expectant management and return precautions for the ED. [DS]    Clinical Course User Index [DS] Vladimir Crofts, MD     FINAL CLINICAL IMPRESSION(S) / ED DIAGNOSES   Final diagnoses:  Generalized weakness     Rx / DC Orders   ED Discharge Orders     None        Note:  This document was prepared using Dragon voice recognition software and may include unintentional dictation errors.   Vladimir Crofts, MD 01/16/23 [phone]

## 2023-06-06 ENCOUNTER — Encounter: Payer: Self-pay | Admitting: Emergency Medicine

## 2023-06-06 ENCOUNTER — Other Ambulatory Visit: Payer: Self-pay

## 2023-06-06 ENCOUNTER — Emergency Department
Admission: EM | Admit: 2023-06-06 | Discharge: 2023-06-06 | Disposition: A | Discharge status: Home / Self Care | Payer: Self-pay | Attending: Emergency Medicine | Admitting: Emergency Medicine

## 2023-06-06 DIAGNOSIS — U071 COVID-19: Secondary | ICD-10-CM | POA: Insufficient documentation

## 2023-06-06 DIAGNOSIS — J069 Acute upper respiratory infection, unspecified: Secondary | ICD-10-CM

## 2023-06-06 LAB — RESP PANEL BY RT-PCR (RSV, FLU A&B, COVID)  RVPGX2
Influenza A by PCR: NEGATIVE
Influenza B by PCR: NEGATIVE
Resp Syncytial Virus by PCR: NEGATIVE
SARS Coronavirus 2 by RT PCR: POSITIVE — AB

## 2023-06-06 MED ORDER — ACETAMINOPHEN 325 MG PO TABS
650.0000 mg | ORAL_TABLET | Freq: Once | ORAL | Status: AC
  Administered 2023-06-06: 650 mg via ORAL
  Filled 2023-06-06: qty 2

## 2023-06-06 MED ORDER — PSEUDOEPHEDRINE HCL 60 MG PO TABS: 60.0000 mg | ORAL_TABLET | Freq: Three times a day (TID) | ORAL | 0 refills | Status: AC | PRN

## 2023-06-06 NOTE — ED Provider Notes (Signed)
[facility] Emergency Department Provider Note     Event Date/Time   First MD Initiated Contact with Patient 06/06/23 1210     (approximate)   History   Fatigue, Cough, and Nasal Congestion   HPI  William Hardin. is a 42 y.o. male who presents to the ED for evaluation of chills and nasal congestion.  Onset last night.  Associated symptoms include cough, fatigue and bodyaches.  Denies productive cough.  Patient has taken nothing for symptoms.  Denies fever, chest pain, shortness of breath, and abdominal pain.     Physical Exam   Triage Vital Signs: ED Triage Vitals  Enc Vitals Group     BP 06/06/23 1205 (!) 126/91     Pulse Rate 06/06/23 1205 (!) 59     Resp 06/06/23 1205 18     Temp 06/06/23 1205 98.1 F (36.7 C)     Temp Source 06/06/23 1205 Oral     SpO2 06/06/23 1205 97 %     Weight 06/06/23 1203 186 lb (84.4 kg)     Height 06/06/23 1203 5\' 7"  (1.702 m)     Head Circumference --      Peak Flow --      Pain Score 06/06/23 1203 7     Pain Loc --      Pain Edu? --      Excl. in GC? --     Most recent vital signs: Vitals:   06/06/23 1205  BP: (!) 126/91  Pulse: (!) 59  Resp: 18  Temp: 98.1 F (36.7 C)  SpO2: 97%   General: Alert and oriented. INAD. Uncomfortable.   Skin:  Warm, dry and intact. No rashes or lesions noted.     Head:  NCAT.  Eyes:  PERRLA. EOMI. Conjunctivae clear. Ears:  EACs patent.  Mild clear fluid behind right tympanic membrane noted. Left TM clear. Nose:   Mucosa is moist. No rhinorrhea. Throat: Oropharynx clear. No erythema or exudates. Tonsils not enlarged.  Uvula is midline. Neck:   No cervical lymphadenopathy.  CV:  Good peripheral perfusion. RRR.  RESP:  Normal effort. LCTAB. No accessory muscle use. ABD:  No distention.    ED Results / Procedures / Treatments   Labs (all labs ordered are listed, but only abnormal results are displayed) Labs Reviewed  RESP PANEL BY RT-PCR (RSV, FLU A&B, COVID)   RVPGX2 - Abnormal; Notable for the following components:      Result Value   SARS Coronavirus 2 by RT PCR POSITIVE (*)    All other components within normal limits   History and physical examination do not warrant a lab work up or imaging at this time.   No results found.  PROCEDURES:  Critical Care performed: No  Procedures  MEDICATIONS ORDERED IN ED: Medications  acetaminophen (TYLENOL) tablet 650 mg (650 mg Oral Given 06/06/23 1250)   IMPRESSION / MDM / ASSESSMENT AND PLAN / ED COURSE  I reviewed the triage vital signs and the nursing notes.                               42 y.o. male presents to the emergency department for evaluation and treatment of chills and nasal congestion with onset of last night. See HPI for further details.   Differential diagnosis includes, but is not limited to COVID, flu, RSV, acute bronchitis, viral pharyngitis, otalgia media.  Physical exam  findings reveal mild serous fluid behind the right tympanic membrane. The patient was administered Tylenol. Respiratory panel ordered. COVID positive.  Patient is provided with CDC guideline for isolation and quarantine rules.  Patient is in stable condition for discharge and outpatient follow-up.  Patient will be discharged home with prescriptions for pseudoephedrine to help with congestion. Patient is to follow up with his primary care as needed or otherwise directed.  A list of local primary care physicians accepting new patients provided. Patient is given ED precautions to return to the ED for any worsening or new symptoms. Patient verbalizes understanding. All questions and concerns were addressed during ED visit.    Patient's presentation is most consistent with acute complicated illness / injury requiring diagnostic workup.  FINAL CLINICAL IMPRESSION(S) / ED DIAGNOSES   Final diagnoses:  Viral URI with cough    Rx / DC Orders   ED Discharge Orders          Ordered    pseudoephedrine (SUDAFED) 60 MG  tablet  Every 8 hours PRN        06/06/23 1328             Note:  This document was prepared using Dragon voice recognition software and may include unintentional dictation errors.    Romeo Apple, Daijah Scrivens A, PA-C 06/06/23 Jun[place], MD 06/06/23 [phone]

## 2023-06-06 NOTE — Discharge Instructions (Addendum)
Your COVID test was positive today.  Stay home and away from others until your symptoms have improved and you are fever free without medication for 24 hours.  Continue to wear mask around others for 5 additional days after symptom free.  Please go to the following website to schedule new (and existing) patient appointments:   [email or web address]   The following is a list of primary care offices in the area who are accepting new patients at this time.  Please reach out to one of them directly and let them know you would like to schedule an appointment to follow up on an Emergency Department visit, and/or to establish a new primary care provider (PCP).  There are likely other primary care clinics in the are who are accepting new patients, but this is an excellent place to start:  [facility] Lead physician: Dr Shirlee Latch [address] [place], [place] [address] [phone]  [facility] Lead Physician: Dr Alba Cory [address], [place], [place] [address] [phone]  [facility]  Lead Physician: Dr Olevia Perches [address] [place], [place] [address] [phone]  [facility] Lead Physician: Dr Sofie Hartigan [address], [place], [place] [address] [phone]  [facility] at [facility] Lead Physician: Dr Bari Edward [address] [place], [place], [place] [address] [phone]

## 2023-06-06 NOTE — ED Notes (Signed)
Swab sent with temporary label to lab at this time.

## 2023-06-06 NOTE — ED Triage Notes (Signed)
Pt via POV from home. Pt c/o fatigue, cough, nasal congestion, chills since last night. Denies sick contacts. Pt is A&OX4 and NAD. Ambulatory to triage.
